# Patient Record
Sex: Female | Born: 1970 | Race: Black or African American | Hispanic: No | Marital: Single | Smoking: Never smoker
Health system: Southern US, Community
[De-identification: ages and names within clinical notes are randomized; demographics above are authoritative.]

## PROBLEM LIST (undated history)

## (undated) DIAGNOSIS — R42 Dizziness and giddiness: Secondary | ICD-10-CM

## (undated) DIAGNOSIS — E079 Disorder of thyroid, unspecified: Secondary | ICD-10-CM

## (undated) HISTORY — PX: LEEP: SHX91

## (undated) HISTORY — DX: Disorder of thyroid, unspecified: E07.9

## (undated) HISTORY — DX: Dizziness and giddiness: R42

## (undated) HISTORY — PX: OTHER SURGICAL HISTORY: SHX169

## (undated) HISTORY — PX: DENTAL SURGERY: SHX609

---

## 2003-06-20 ENCOUNTER — Emergency Department (HOSPITAL_COMMUNITY): Admission: EM | Admit: 2003-06-20 | Discharge: 2003-06-21 | Payer: Self-pay | Admitting: *Deleted

## 2003-10-22 ENCOUNTER — Emergency Department (HOSPITAL_COMMUNITY): Admission: EM | Admit: 2003-10-22 | Discharge: 2003-10-22 | Payer: Self-pay | Admitting: Emergency Medicine

## 2005-01-03 ENCOUNTER — Other Ambulatory Visit: Admission: RE | Admit: 2005-01-03 | Discharge: 2005-01-03 | Payer: Self-pay | Admitting: *Deleted

## 2008-08-11 ENCOUNTER — Encounter: Payer: Self-pay | Admitting: Orthopedic Surgery

## 2008-08-15 ENCOUNTER — Ambulatory Visit: Payer: Self-pay | Admitting: Orthopedic Surgery

## 2008-08-15 DIAGNOSIS — M24876 Other specific joint derangements of unspecified foot, not elsewhere classified: Secondary | ICD-10-CM

## 2008-08-15 DIAGNOSIS — M24873 Other specific joint derangements of unspecified ankle, not elsewhere classified: Secondary | ICD-10-CM

## 2009-09-04 ENCOUNTER — Ambulatory Visit: Payer: Self-pay | Admitting: Orthopedic Surgery

## 2009-09-04 DIAGNOSIS — M542 Cervicalgia: Secondary | ICD-10-CM | POA: Insufficient documentation

## 2009-09-04 DIAGNOSIS — M25519 Pain in unspecified shoulder: Secondary | ICD-10-CM

## 2009-09-04 DIAGNOSIS — M7580 Other shoulder lesions, unspecified shoulder: Secondary | ICD-10-CM

## 2009-10-11 ENCOUNTER — Encounter: Payer: Self-pay | Admitting: Orthopedic Surgery

## 2010-12-03 ENCOUNTER — Other Ambulatory Visit: Payer: Self-pay | Admitting: Obstetrics and Gynecology

## 2010-12-03 DIAGNOSIS — Z1231 Encounter for screening mammogram for malignant neoplasm of breast: Secondary | ICD-10-CM

## 2010-12-10 ENCOUNTER — Ambulatory Visit
Admission: RE | Admit: 2010-12-10 | Discharge: 2010-12-10 | Disposition: A | Discharge status: Home / Self Care | Payer: Managed Care, Other (non HMO) | Source: Ambulatory Visit | Attending: Obstetrics and Gynecology | Admitting: Obstetrics and Gynecology

## 2010-12-10 DIAGNOSIS — Z1231 Encounter for screening mammogram for malignant neoplasm of breast: Secondary | ICD-10-CM

## 2011-11-11 ENCOUNTER — Other Ambulatory Visit: Payer: Self-pay | Admitting: Obstetrics and Gynecology

## 2011-11-11 DIAGNOSIS — Z1231 Encounter for screening mammogram for malignant neoplasm of breast: Secondary | ICD-10-CM

## 2011-12-18 ENCOUNTER — Ambulatory Visit
Admission: RE | Admit: 2011-12-18 | Discharge: 2011-12-18 | Disposition: A | Discharge status: Home / Self Care | Payer: Self-pay | Source: Ambulatory Visit | Attending: Obstetrics and Gynecology | Admitting: Obstetrics and Gynecology

## 2011-12-18 DIAGNOSIS — Z1231 Encounter for screening mammogram for malignant neoplasm of breast: Secondary | ICD-10-CM

## 2013-01-22 ENCOUNTER — Encounter: Payer: Self-pay | Admitting: General Practice

## 2013-01-22 ENCOUNTER — Ambulatory Visit (INDEPENDENT_AMBULATORY_CARE_PROVIDER_SITE_OTHER): Payer: Managed Care, Other (non HMO) | Admitting: General Practice

## 2013-01-22 VITALS — BP 123/87 | HR 70 | Temp 97.0°F | Ht 65.0 in | Wt 228.0 lb

## 2013-01-22 DIAGNOSIS — E039 Hypothyroidism, unspecified: Secondary | ICD-10-CM | POA: Insufficient documentation

## 2013-01-22 DIAGNOSIS — E559 Vitamin D deficiency, unspecified: Secondary | ICD-10-CM

## 2013-01-22 MED ORDER — LEVOTHYROXINE SODIUM 100 MCG PO TABS: 100.0000 ug | ORAL_TABLET | Freq: Every day | ORAL | Status: DC

## 2013-01-22 NOTE — Progress Notes (Signed)
  Subjective:    Patient ID: Linda Rollins, female    DOB: 11/15/1970, 42 y.o.   MRN: [id number]  HPI    SUBJECTIVE: Linda Rollins is a 42 y.o. female here for follow up of hypothyroidism. Has not taken Synthroid in 2 months. Has not been exercising regularly. Started incorporating bread back into diet. Does not eat red meat or drink soda Scheduled Meds: Synthroid 100 mg QD Continuous Infusions: PRN Meds:    No results found for this basename: TSH  Review of systems:  Thyroid ROS: denies fatigue, weight changes, heat/cold intolerance, bowel/skin changes or CVS symptoms, weight gain, feeling cold and cold intolerance and constipation.         Review of Systems     Objective:   Physical Exam  Constitutional: She is oriented to person, place, and time. She appears well-developed and well-nourished.  HENT:  Head: Normocephalic.  Right Ear: External ear normal.  Left Ear: External ear normal.  Eyes: Conjunctivae and EOM are normal. Pupils are equal, round, and reactive to light.  Neck: Normal range of motion. Neck supple.  Cardiovascular: Regular rhythm and normal heart sounds.   Pulmonary/Chest: Effort normal and breath sounds normal.  Abdominal: Soft. Bowel sounds are normal.  Neurological: She is alert and oriented to person, place, and time.  Skin: Skin is warm and dry.  Psychiatric: She has a normal mood and affect. Her behavior is normal. Judgment and thought content normal.   OBJECTIVE: Exam: thyroid is normal in size without nodules or tenderness, no tremor noted.        Assessment & Plan:  Hypothyroidism  Synthyroid 100 mg QD Exercise/Diet F/U in 6 weeks, will check labs then Patient verbalized understanding and denies any questions  Raymon Mutton, FNP-C

## 2013-01-22 NOTE — Patient Instructions (Signed)

## 2013-04-09 ENCOUNTER — Telehealth: Payer: Self-pay | Admitting: Nurse Practitioner

## 2013-04-09 ENCOUNTER — Other Ambulatory Visit: Payer: Self-pay

## 2013-04-09 DIAGNOSIS — Z1231 Encounter for screening mammogram for malignant neoplasm of breast: Secondary | ICD-10-CM

## 2013-04-29 ENCOUNTER — Ambulatory Visit: Payer: Managed Care, Other (non HMO)

## 2013-05-21 ENCOUNTER — Ambulatory Visit
Admission: RE | Admit: 2013-05-21 | Discharge: 2013-05-21 | Disposition: A | Discharge status: Home / Self Care | Payer: Managed Care, Other (non HMO) | Source: Ambulatory Visit

## 2013-05-21 DIAGNOSIS — Z1231 Encounter for screening mammogram for malignant neoplasm of breast: Secondary | ICD-10-CM

## 2013-05-24 ENCOUNTER — Ambulatory Visit (INDEPENDENT_AMBULATORY_CARE_PROVIDER_SITE_OTHER): Payer: Managed Care, Other (non HMO) | Admitting: Nurse Practitioner

## 2013-05-24 ENCOUNTER — Encounter: Payer: Self-pay | Admitting: Nurse Practitioner

## 2013-05-24 ENCOUNTER — Other Ambulatory Visit: Payer: Self-pay

## 2013-05-24 VITALS — BP 129/95 | HR 80 | Temp 97.2°F | Ht 64.0 in | Wt 237.0 lb

## 2013-05-24 DIAGNOSIS — E039 Hypothyroidism, unspecified: Secondary | ICD-10-CM

## 2013-05-24 DIAGNOSIS — R635 Abnormal weight gain: Secondary | ICD-10-CM

## 2013-05-24 MED ORDER — PHENTERMINE HCL 37.5 MG PO TABS: 37.5000 mg | ORAL_TABLET | Freq: Every day | ORAL | Status: DC

## 2013-05-24 MED ORDER — LEVOTHYROXINE SODIUM 100 MCG PO TABS: 100.0000 ug | ORAL_TABLET | Freq: Every day | ORAL | Status: DC

## 2013-05-24 NOTE — Progress Notes (Signed)
  Subjective:    Patient ID: Linda Rollins, female    DOB: [birth year], 42 y.o.   MRN: [id number]  Thyroid Problem Presents for follow-up (hypothyroidism) visit. Patient reports no anxiety, cold intolerance, depressed mood, diaphoresis, diarrhea, dry skin, fatigue, hair loss, hoarse voice, leg swelling, menstrual problem, nail problem, palpitations, visual change, weight gain or weight loss. The symptoms have been stable.  Obesity Patient would like to try a diet pill- has tried dieting alone without success.   Review of Systems  Constitutional: Negative for weight loss, weight gain, diaphoresis and fatigue.  HENT: Negative for hoarse voice.   Cardiovascular: Negative for palpitations.  Gastrointestinal: Negative for diarrhea.  Endocrine: Negative for cold intolerance.  Genitourinary: Negative for menstrual problem.  All other systems reviewed and are negative.       Objective:   Physical Exam  Constitutional: She is oriented to person, place, and time. She appears well-developed and well-nourished.  HENT:  Nose: Nose normal.  Mouth/Throat: Oropharynx is clear and moist.  Eyes: EOM are normal.  Neck: Trachea normal, normal range of motion and full passive range of motion without pain. Neck supple. No JVD present. Carotid bruit is not present. No thyromegaly present.  Cardiovascular: Normal rate, regular rhythm, normal heart sounds and intact distal pulses.  Exam reveals no gallop and no friction rub.   No murmur heard. Pulmonary/Chest: Effort normal and breath sounds normal.  Abdominal: Soft. Bowel sounds are normal. She exhibits no distension and no mass. There is no tenderness.  Musculoskeletal: Normal range of motion.  Lymphadenopathy:    She has no cervical adenopathy.  Neurological: She is alert and oriented to person, place, and time. She has normal reflexes.  Skin: Skin is warm and dry.  Psychiatric: She has a normal mood and affect. Her behavior is normal. Judgment and  thought content normal.    BP 129/95  Pulse 80  Temp(Src) 97.2 F (36.2 C) (Oral)  Ht 5\' 4"  (1.626 m)  Wt 237 lb (107.502 kg)  BMI 40.66 kg/m2       Assessment & Plan:  1. Hypothyroidism - levothyroxine (SYNTHROID, LEVOTHROID) 100 MCG tablet; Take 1 tablet (100 mcg total) by mouth daily.  Dispense: 30 tablet; Refill: 7 - CMP14+EGFR - Thyroid Panel With TSH  2. Morbid obesity Low fat diet and exercise Discussed benefits and risk of adipex- patient showed understanding Recheck in 2 months to see if weight is coming down - phentermine (ADIPEX-P) 37.5 MG tablet; Take 1 tablet (37.5 mg total) by mouth daily before breakfast.  Dispense: 30 tablet; Refill: 2  Mary-Margaret Daphine Deutscher, FNP

## 2013-05-24 NOTE — Patient Instructions (Signed)
1200 Calorie Diabetic Diet The 1200 calorie diabetic diet limits calories to 1200 each day. Following this diet and making healthy meal choices can help improve overall health. It controls blood glucose (sugar) levels and can also help lower blood pressure and cholesterol.  SERVING SIZES Measuring foods and serving sizes helps to make sure you are getting the right amount of food. The list below tells how big or small some common serving sizes are.   1 oz.........4 stacked dice.  3 oz.........Deck of cards.  1 tsp........Tip of little finger.  1 tbs........Thumb.  2 tbs........Golf ball.   cup.......Half of a fist.  1 cup........A fist. GUIDELINES FOR CHOOSING FOODS The goal of this diet is to eat a variety of foods and limit calories to 1200 each day. This can be done by choosing foods that are low in calories and fat. The diet also suggests eating small amounts of food frequently. Doing this helps control your blood glucose levels, so they do not get too high or too low. Each meal or snack may include a protein food source to help you feel more satisfied. Try to eat about the same amount of food around the same time each day. This includes weekend days, travel days, and days off work. Space your meals about 4 to 5 hours apart, and add a snack between them, if you wish.  For example, a daily food plan could include breakfast, a morning snack, lunch, dinner, and an evening snack. Healthy meals and snacks have different types of foods, including whole grains, vegetables, fruits, lean meats, poultry, fish, and dairy products. As you plan your meals, select a variety of foods. Choose from the bread and starch, vegetable, fruit, dairy, and meat/protein groups. Examples of foods from each group are listed below, with their suggested serving sizes. Use measuring cups and spoons to become familiar with what a healthy portion looks like. Bread and Starch Each serving equals 15 grams of  carbohydrate.  1 slice bread.   bagel.   cup cold cereal (unsweetened).   cup hot cereal or mashed potatoes.  1 small potato (size of a computer mouse).   cup cooked pasta or rice.   English muffin.  1 cup broth-based soup.  3 cups of popcorn.  4 to 6 whole-wheat crackers.   cup cooked beans, peas, or corn. Vegetables Each serving equals 5 grams of carbohydrate.   cup cooked vegetables.  1 cup raw vegetables.   cup tomato or vegetable juice. Fruit Each serving equals 15 grams of carbohydrate.  1 small apple or orange.  1  cup watermelon or strawberries.   cup applesauce (no sugar added).  2 tbs raisins.   banana.   cup canned fruit, packed in water or in its own juice.   cup unsweetened fruit juice. Dairy Each serving equals 12 to 15 grams of carbohydrate.  1 cup fat-free milk.  6 oz artificially sweetened yogurt or plain yogurt.  1 cup low-fat buttermilk.  1 cup soy milk.  1 cup almond milk. Meat/Protein  1 large egg.  2 to 3 oz meat, poultry, or fish.   cup low-fat cottage cheese.  1 tbs peanut butter.  1 oz low-fat cheese.   cup tuna, packed in water.   cup tofu. Fat  1 tsp oil.  1 tsp trans-fat-free margarine.  1 tsp butter.  1 tsp mayonnaise.  2 tbs avocado.  1 tbs salad dressing.  1 tbs cream cheese.  2 tbs sour cream. SAMPLE 1200 CALORIE   DIET PLAN Breakfast  1 cup fat-free milk (1 carb serving).  1 small orange (1 carb serving).  1 scrambled egg.  1 slice whole-wheat toast (1 carb serving). Lunch  Sandwich  2 slices whole-wheat bread (2 carb servings).  2 oz lean meat.  2 tsp reduced fat mayonnaise.  1 lettuce leaf.  2 slices tomato.  1 cup carrot sticks. Afternoon Snack  1 small apple (1 carb serving).  1 string cheese. Dinner  2 oz meat.  1 small baked potato (1 carb serving).  1 tsp trans-fat-free margarine.  1 cup steamed broccoli.  1 cup fat-free milk (1 carb  serving). Evening Snack   small banana (1 carb serving).  6 vanilla wafers (1 carb serving). MEAL PLAN You can use this worksheet to help you make a daily meal plan based on the 1200 calorie diabetic diet suggestions. If you are using this plan to help you control your blood glucose, you may interchange carbohydrate containing foods (dairy, starches, and fruits). Select a variety of fresh foods of varying colors and flavors. The total amount of carbohydrate in your meals or snacks is more important than making sure you include all of the food groups every time you eat. You can choose from approximately this many of the following foods to build your day's meals:  6 Starches.  3 Vegetables.  2 Fruits.  2 Dairy.  4 to 6 oz Meat/Protein.  Up to 3 Fats. Your dietician can use this worksheet to help you decide how many servings and which types of foods are right for you. BREAKFAST Food Group and Servings / Food Choice Starch _________________________________________________________ Dairy __________________________________________________________ Fruit ___________________________________________________________ Meat/Protein____________________________________________________ Fat ____________________________________________________________ LUNCH Food Group and Servings / Food Choice  Starch _________________________________________________________ Meat/Protein ___________________________________________________ Vegetables _____________________________________________________ Fruit __________________________________________________________ Dairy __________________________________________________________ Fat ____________________________________________________________ AFTERNOON SNACK Food Group and Servings / Food Choice Dairy __________________________________________________________ Fruit ___________________________________________________________ Starch  __________________________________________________________ Meat/Protein____________________________________________________ DINNER Food Group and Servings / Food Choice Starch _________________________________________________________ Meat/Protein ___________________________________________________ Dairy __________________________________________________________ Vegetables _____________________________________________________ Fruit __________________________________________________________ Fat ____________________________________________________________ EVENING SNACK Food Group and Servings / Food Choice Fruit ___________________________________________________________ Meat/Protein ____________________________________________________ Dairy __________________________________________________________ Starch __________________________________________________________ DAILY TOTALS Starches _________________________ Vegetables _______________________ Fruits ____________________________ Dairy ____________________________ Meat/Protein______________________ Fats _____________________________ Document Released: 05/06/2005 Document Revised: 01/06/2012 Document Reviewed: 08/31/2009 [employer] Patient Information 2014 [employer].  

## 2013-05-25 ENCOUNTER — Other Ambulatory Visit: Payer: Self-pay | Admitting: Nurse Practitioner

## 2013-05-25 LAB — CMP14+EGFR
AST: 21 IU/L (ref 0–40)
Albumin/Globulin Ratio: 1.5 (ref 1.1–2.5)
Alkaline Phosphatase: 64 IU/L (ref 39–117)
BUN/Creatinine Ratio: 17 (ref 9–23)
CO2: 21 mmol/L (ref 18–29)
Creatinine, Ser: 0.78 mg/dL (ref 0.57–1.00)
Globulin, Total: 2.8 g/dL (ref 1.5–4.5)
Sodium: 142 mmol/L (ref 134–144)

## 2013-05-25 LAB — THYROID PANEL WITH TSH: T4, Total: 7.5 ug/dL (ref 4.5–12.0)

## 2013-05-25 MED ORDER — LEVOTHYROXINE SODIUM 125 MCG PO TABS: 125.0000 ug | ORAL_TABLET | Freq: Every day | ORAL | Status: DC

## 2013-05-27 ENCOUNTER — Telehealth: Payer: Self-pay | Admitting: Nurse Practitioner

## 2013-05-28 ENCOUNTER — Telehealth: Payer: Self-pay | Admitting: Nurse Practitioner

## 2013-05-28 NOTE — Telephone Encounter (Signed)
Patient notified of lab results

## 2013-12-01 ENCOUNTER — Other Ambulatory Visit: Payer: Self-pay | Admitting: Nurse Practitioner

## 2013-12-10 ENCOUNTER — Other Ambulatory Visit: Payer: Self-pay | Admitting: Nurse Practitioner

## 2013-12-17 ENCOUNTER — Emergency Department (HOSPITAL_COMMUNITY): Payer: Managed Care, Other (non HMO)

## 2013-12-17 ENCOUNTER — Encounter (HOSPITAL_COMMUNITY): Payer: Self-pay | Admitting: Emergency Medicine

## 2013-12-17 ENCOUNTER — Emergency Department (HOSPITAL_COMMUNITY)
Admission: EM | Admit: 2013-12-17 | Discharge: 2013-12-17 | Disposition: A | Discharge status: Home / Self Care | Payer: Managed Care, Other (non HMO) | Attending: Emergency Medicine | Admitting: Emergency Medicine

## 2013-12-17 DIAGNOSIS — R079 Chest pain, unspecified: Secondary | ICD-10-CM

## 2013-12-17 DIAGNOSIS — R0789 Other chest pain: Secondary | ICD-10-CM | POA: Insufficient documentation

## 2013-12-17 DIAGNOSIS — R51 Headache: Secondary | ICD-10-CM | POA: Insufficient documentation

## 2013-12-17 DIAGNOSIS — Z79899 Other long term (current) drug therapy: Secondary | ICD-10-CM | POA: Insufficient documentation

## 2013-12-17 DIAGNOSIS — R519 Headache, unspecified: Secondary | ICD-10-CM

## 2013-12-17 DIAGNOSIS — E079 Disorder of thyroid, unspecified: Secondary | ICD-10-CM | POA: Insufficient documentation

## 2013-12-17 LAB — BASIC METABOLIC PANEL
BUN: 8 mg/dL (ref 6–23)
CALCIUM: 9.2 mg/dL (ref 8.4–10.5)
CO2: 28 mEq/L (ref 19–32)
CREATININE: 0.87 mg/dL (ref 0.50–1.10)
Chloride: 104 mEq/L (ref 96–112)
GFR calc Af Amer: >90 mL/min (ref >90)
GFR calc non Af Amer: 80 mL/min — ABNORMAL LOW (ref >90)
Glucose, Bld: 80 mg/dL (ref 70–99)
Potassium: 3.6 mEq/L — ABNORMAL LOW (ref 3.7–5.3)
Sodium: 142 mEq/L (ref 137–147)

## 2013-12-17 LAB — CBC WITH DIFFERENTIAL/PLATELET
Basophils Absolute: 0 10*3/uL (ref 0.0–0.1)
Basophils Relative: 0 % (ref 0–1)
EOS ABS: 0.1 10*3/uL (ref 0.0–0.7)
EOS PCT: 1 % (ref 0–5)
HCT: 37.3 % (ref 36.0–46.0)
HEMOGLOBIN: 12.7 g/dL (ref 12.0–15.0)
Lymphocytes Relative: 24 % (ref 12–46)
Lymphs Abs: 1.8 10*3/uL (ref 0.7–4.0)
MCH: 30.3 pg (ref 26.0–34.0)
MCHC: 34 g/dL (ref 30.0–36.0)
MCV: 89 fL (ref 78.0–100.0)
MONOS PCT: 5 % (ref 3–12)
Monocytes Absolute: 0.4 10*3/uL (ref 0.1–1.0)
Neutro Abs: 5.4 10*3/uL (ref 1.7–7.7)
Neutrophils Relative %: 70 % (ref 43–77)
Platelets: 306 10*3/uL (ref 150–400)
RBC: 4.19 MIL/uL (ref 3.87–5.11)
RDW: 12 % (ref 11.5–15.5)
WBC: 7.7 10*3/uL (ref 4.0–10.5)

## 2013-12-17 LAB — TROPONIN I
Troponin I: <0.3 ng/mL (ref <0.30)
Troponin I: <0.3 ng/mL (ref <0.30)

## 2013-12-17 MED ORDER — SODIUM CHLORIDE 0.9 % IV BOLUS (SEPSIS)
1000.0000 mL | Freq: Once | INTRAVENOUS | Status: AC
  Administered 2013-12-17: 1000 mL via INTRAVENOUS

## 2013-12-17 MED ORDER — KETOROLAC TROMETHAMINE 30 MG/ML IJ SOLN
30.0000 mg | Freq: Once | INTRAMUSCULAR | Status: AC
  Administered 2013-12-17: 30 mg via INTRAVENOUS
  Filled 2013-12-17: qty 1

## 2013-12-17 NOTE — ED Notes (Signed)
PT STATES SHE HAS BAD HEADACHE. STARTED THIS MORNING. PAIN ALSO RADIATES TO LEFT ARM. STATES SHE HAS BEEN UNDER A LOT OF STRESS LATELY. SHE SAYS SHE HAS HAD MIGRAINES IN PAST BUT THIS IS MUCH WORSE. POINTS TO PAIN LOCATION AS ACROSS FOREHEAD AND IN TEMPORAL AREAS.

## 2013-12-17 NOTE — ED Provider Notes (Signed)
CSN: [id number]     Arrival date & time 12/17/13  1205 History   First MD Initiated Contact with Patient 12/17/13 1233     Chief Complaint  Patient presents with  . Headache     (Consider location/radiation/quality/duration/timing/severity/associated sxs/prior Treatment) HPI  This is a 27 are old female with history of thyroid disease who presents with headache and chest pain.  Patient reports one week of headache. She states it's gradually gotten worse and worse and this morning. Rates pain a 10/10. Has not taken anything at home for it. She denies any fevers or neck stiffness. She has a remote history of migraines but feels this is worse. She denies any vision changes, weakness, numbness, tingling. Patient also reports intermittent chest pain and left arm pressure over the last several days. It is not worse with exertion.  She denies any shortness of breath diaphoresis. She denies a history of hypertension, hyperlipidemia, or early family history of heart disease. She's not a current smoker. She reports increased stress at home.  Past Medical History  Diagnosis Date  . Thyroid disease    History reviewed. No pertinent past surgical history. History reviewed. No pertinent family history. History  Substance Use Topics  . Smoking status: Never Smoker   . Smokeless tobacco: Not on file  . Alcohol Use: No   OB History   Grav Para Term Preterm Abortions TAB SAB Ect Mult Living   2 1             Review of Systems  Constitutional: Negative for fever.  Respiratory: Positive for chest tightness. Negative for cough and shortness of breath.   Cardiovascular: Positive for chest pain. Negative for leg swelling.  Gastrointestinal: Negative for nausea, vomiting and abdominal pain.  Genitourinary: Negative for dysuria.  Musculoskeletal: Negative for back pain and neck pain.  Skin: Negative for wound.  Neurological: Positive for headaches. Negative for dizziness, speech difficulty, weakness  and numbness.  Psychiatric/Behavioral: Negative for confusion.  All other systems reviewed and are negative.      Allergies  Review of patient's allergies indicates no known allergies.  Home Medications   Current Outpatient Rx  Name  Route  Sig  Dispense  Refill  . acetaminophen (TYLENOL) 500 MG tablet   Oral   Take 1,000 mg by mouth daily as needed for headache.         . levothyroxine (SYNTHROID, LEVOTHROID) 125 MCG tablet   Oral   Take 1 tablet (125 mcg total) by mouth daily.   90 tablet   1    BP 122/48  Pulse 65  Temp(Src) 98 F (36.7 C) (Oral)  Resp 18  SpO2 98%  LMP 12/17/2013 Physical Exam  Nursing note and vitals reviewed. Constitutional: She is oriented to person, place, and time. She appears well-developed and well-nourished. No distress.  HENT:  Head: Normocephalic and atraumatic.  Eyes: Pupils are equal, round, and reactive to light.  Neck: Neck supple.  Cardiovascular: Normal rate, regular rhythm and normal heart sounds.   No murmur heard. Pulmonary/Chest: Effort normal and breath sounds normal. No respiratory distress. She has no wheezes.  Abdominal: Soft. Bowel sounds are normal. There is no tenderness. There is no rebound.  Musculoskeletal: She exhibits no edema.  Neurological: She is alert and oriented to person, place, and time.  Cranial nerves II through XII intact. No dysmetria to finger-nose-finger. 5/5 strength in extremities  Skin: Skin is warm and dry.  Psychiatric: She has a normal mood and affect.  ED Course  Procedures (including critical care time) Labs Review Labs Reviewed  BASIC METABOLIC PANEL - Abnormal; Notable for the following:    Potassium 3.6 (*)    GFR calc non Af Amer 80 (*)    All other components within normal limits  CBC WITH DIFFERENTIAL  TROPONIN I  TROPONIN I   Imaging Review Dg Chest 2 View  12/17/2013   CLINICAL DATA:  Chest tightness  EXAM: CHEST  2 VIEW  COMPARISON:  None.  FINDINGS: The lungs are  clear. Heart size and pulmonary vascularity are normal. No adenopathy. No pneumothorax. There is slight upper thoracic dextroscoliosis.  IMPRESSION: No edema or consolidation.   Electronically Signed   By: Bretta BangWilliam  Woodruff M.D.   On: 12/17/2013 13:12    EKG Interpretation    Date/Time:  Friday December 17 2013 12:55:14 EST Ventricular Rate:  67 PR Interval:  178 QRS Duration: 98 QT Interval:  396 QTC Calculation: 418 R Axis:   34 Text Interpretation:  Normal sinus rhythm Normal ECG No previous ECGs available Confirmed by HORTON  MD, COURTNEY ([id number]) on 12/17/2013 2:29:45 PM            MDM   Final diagnoses:  Headache  Chest pain   Patient presents with headache and intermittent chest pain. She is low-risk for ACS and has no risk factors. Initial EKG normal and troponin negative. Chest x-ray negative. Regarding the patient's headache, she is nonfocal exam. She's not taken anything for her headache. She was given fluids and Toradol with marked improvement of her pain. She is afebrile and has no meningismus on exam. Low suspicion for meningitis or intracranial abnormality. Delta troponin is negative. Patient is low risk. She will followup with her primary care physician for referral for stress testing.  After history, exam, and medical workup I feel the patient has been appropriately medically screened and is safe for discharge home. Pertinent diagnoses were discussed with the patient. Patient was given return precautions.     Shon Batonourtney F Horton, MD 12/18/13 [id number]

## 2013-12-17 NOTE — ED Notes (Addendum)
Pt c/o headache x 2-3 days. C/o left arm numbness since yesterday. Chest tightness since last night intermittent. Pt states she is under a lot of stress. Nad. Nondiaphoretic. Denies cp at this time.

## 2013-12-17 NOTE — Discharge Instructions (Signed)
Chest Pain (Nonspecific) °It is often hard to give a specific diagnosis for the cause of chest pain. There is always a chance that your pain could be related to something serious, such as a heart attack or a blood clot in the lungs. You need to follow up with your caregiver for further evaluation. °CAUSES  °· Heartburn. °· Pneumonia or bronchitis. °· Anxiety or stress. °· Inflammation around your heart (pericarditis) or lung (pleuritis or pleurisy). °· A blood clot in the lung. °· A collapsed lung (pneumothorax). It can develop suddenly on its own (spontaneous pneumothorax) or from injury (trauma) to the chest. °· Shingles infection (herpes zoster virus). °The chest wall is composed of bones, muscles, and cartilage. Any of these can be the source of the pain. °· The bones can be bruised by injury. °· The muscles or cartilage can be strained by coughing or overwork. °· The cartilage can be affected by inflammation and become sore (costochondritis). °DIAGNOSIS  °Lab tests or other studies, such as X-rays, electrocardiography, stress testing, or cardiac imaging, may be needed to find the cause of your pain.  °TREATMENT  °· Treatment depends on what may be causing your chest pain. Treatment may include: °· Acid blockers for heartburn. °· Anti-inflammatory medicine. °· Pain medicine for inflammatory conditions. °· Antibiotics if an infection is present. °· You may be advised to change lifestyle habits. This includes stopping smoking and avoiding alcohol, caffeine, and chocolate. °· You may be advised to keep your head raised (elevated) when sleeping. This reduces the chance of acid going backward from your stomach into your esophagus. °· Most of the time, nonspecific chest pain will improve within 2 to 3 days with rest and mild pain medicine. °HOME CARE INSTRUCTIONS  °· If antibiotics were prescribed, take your antibiotics as directed. Finish them even if you start to feel better. °· For the next few days, avoid physical  activities that bring on chest pain. Continue physical activities as directed. °· Do not smoke. °· Avoid drinking alcohol. °· Only take over-the-counter or prescription medicine for pain, discomfort, or fever as directed by your caregiver. °· Follow your caregiver's suggestions for further testing if your chest pain does not go away. °· Keep any follow-up appointments you made. If you do not go to an appointment, you could develop lasting (chronic) problems with pain. If there is any problem keeping an appointment, you must call to reschedule. °SEEK MEDICAL CARE IF:  °· You think you are having problems from the medicine you are taking. Read your medicine instructions carefully. °· Your chest pain does not go away, even after treatment. °· You develop a rash with blisters on your chest. °SEEK IMMEDIATE MEDICAL CARE IF:  °· You have increased chest pain or pain that spreads to your arm, neck, jaw, back, or abdomen. °· You develop shortness of breath, an increasing cough, or you are coughing up blood. °· You have severe back or abdominal pain, feel nauseous, or vomit. °· You develop severe weakness, fainting, or chills. °· You have a fever. °THIS IS AN EMERGENCY. Do not wait to see if the pain will go away. Get medical help at once. Call your local emergency services (911 in [place] Do not drive yourself to the hospital. °MAKE SURE YOU:  °· Understand these instructions. °· Will watch your condition. °· Will get help right away if you are not doing well or get worse. °Document Released: 07/24/2005 Document Revised: 01/06/2012 Document Reviewed: 05/19/2008 °Exit[employer]® Patient Information ©2014 [employer]. ° °Migraine Headache °A migraine headache is an intense, throbbing pain on one or both sides of your head. A migraine can last for 30 minutes to several hours. °CAUSES  °The exact cause of a migraine headache is not always known. However, a migraine may be caused when nerves in the brain become irritated and release  chemicals that cause inflammation. This causes pain. °Certain things may also trigger migraines, such as: °· Alcohol. °· Smoking. °· Stress. °· Menstruation. °· Aged cheeses. °· Foods or drinks that contain nitrates, glutamate, aspartame, or tyramine. °· Lack of sleep. °· Chocolate. °· Caffeine. °· Hunger. °· Physical exertion. °· Fatigue. °· Medicines used to treat chest pain (nitroglycerine), birth control pills, estrogen, and some blood pressure medicines. °SIGNS AND SYMPTOMS °· Pain on one or both sides of your head. °· Pulsating or throbbing pain. °· Severe pain that prevents daily activities. °· Pain that is aggravated by any physical activity. °· Nausea, vomiting, or both. °· Dizziness. °· Pain with exposure to bright lights, loud noises, or activity. °· General sensitivity to bright lights, loud noises, or smells. °Before you get a migraine, you may get warning signs that a migraine is coming (aura). An aura may include: °· Seeing flashing lights. °· Seeing bright spots, halos, or zig-zag lines. °· Having tunnel vision or blurred vision. °· Having feelings of numbness or tingling. °· Having trouble talking. °· Having muscle weakness. °DIAGNOSIS  °A migraine headache is often diagnosed based on: °· Symptoms. °· Physical exam. °· A CT scan or MRI of your head. These imaging tests cannot diagnose migraines, but they can help rule out other causes of headaches. °TREATMENT °Medicines may be given for pain and nausea. Medicines can also be given to help prevent recurrent migraines.  °HOME CARE INSTRUCTIONS °· Only take over-the-counter or prescription medicines for pain or discomfort as directed by your health care provider. The use of long-term narcotics is not recommended. °· Lie down in a dark, quiet room when you have a migraine. °· Keep a journal to find out what may trigger your migraine headaches. For example, write down: °· What you eat and drink. °· How much sleep you get. °· Any change to your diet or  medicines. °· Limit alcohol consumption. °· Quit smoking if you smoke. °· Get 7 9 hours of sleep, or as recommended by your health care provider. °· Limit stress. °· Keep lights dim if bright lights bother you and make your migraines worse. °SEEK IMMEDIATE MEDICAL CARE IF:  °· Your migraine becomes severe. °· You have a fever. °· You have a stiff neck. °· You have vision loss. °· You have muscular weakness or loss of muscle control. °· You start losing your balance or have trouble walking. °· You feel faint or pass out. °· You have severe symptoms that are different from your first symptoms. °MAKE SURE YOU:  °· Understand these instructions. °· Will watch your condition. °· Will get help right away if you are not doing well or get worse. °Document Released: 10/14/2005 Document Revised: 08/04/2013 Document Reviewed: 06/21/2013 °ExitCare® Patient Information ©2014 [employer]. ° °

## 2014-05-17 ENCOUNTER — Other Ambulatory Visit: Payer: Self-pay

## 2014-05-17 DIAGNOSIS — Z1231 Encounter for screening mammogram for malignant neoplasm of breast: Secondary | ICD-10-CM

## 2014-06-13 ENCOUNTER — Ambulatory Visit
Admission: RE | Admit: 2014-06-13 | Discharge: 2014-06-13 | Disposition: A | Discharge status: Home / Self Care | Payer: Managed Care, Other (non HMO) | Source: Ambulatory Visit

## 2014-06-13 DIAGNOSIS — Z1231 Encounter for screening mammogram for malignant neoplasm of breast: Secondary | ICD-10-CM

## 2014-08-29 ENCOUNTER — Encounter (HOSPITAL_COMMUNITY): Payer: Self-pay | Admitting: Emergency Medicine

## 2015-04-03 ENCOUNTER — Emergency Department (HOSPITAL_COMMUNITY)
Admission: EM | Admit: 2015-04-03 | Discharge: 2015-04-03 | Disposition: A | Discharge status: Home / Self Care | Payer: BLUE CROSS/BLUE SHIELD | Attending: Emergency Medicine | Admitting: Emergency Medicine

## 2015-04-03 ENCOUNTER — Encounter (HOSPITAL_COMMUNITY): Payer: Self-pay | Admitting: Emergency Medicine

## 2015-04-03 DIAGNOSIS — Y9302 Activity, running: Secondary | ICD-10-CM | POA: Insufficient documentation

## 2015-04-03 DIAGNOSIS — Y998 Other external cause status: Secondary | ICD-10-CM | POA: Diagnosis not present

## 2015-04-03 DIAGNOSIS — Z79899 Other long term (current) drug therapy: Secondary | ICD-10-CM | POA: Diagnosis not present

## 2015-04-03 DIAGNOSIS — S3991XA Unspecified injury of abdomen, initial encounter: Secondary | ICD-10-CM | POA: Diagnosis present

## 2015-04-03 DIAGNOSIS — W01198A Fall on same level from slipping, tripping and stumbling with subsequent striking against other object, initial encounter: Secondary | ICD-10-CM | POA: Insufficient documentation

## 2015-04-03 DIAGNOSIS — Y9289 Other specified places as the place of occurrence of the external cause: Secondary | ICD-10-CM | POA: Insufficient documentation

## 2015-04-03 DIAGNOSIS — R109 Unspecified abdominal pain: Secondary | ICD-10-CM

## 2015-04-03 DIAGNOSIS — E079 Disorder of thyroid, unspecified: Secondary | ICD-10-CM | POA: Insufficient documentation

## 2015-04-03 MED ORDER — NAPROXEN 250 MG PO TABS
500.0000 mg | ORAL_TABLET | Freq: Once | ORAL | Status: AC
  Administered 2015-04-03: 500 mg via ORAL
  Filled 2015-04-03: qty 2

## 2015-04-03 MED ORDER — NAPROXEN 500 MG PO TABS: 500.0000 mg | ORAL_TABLET | Freq: Two times a day (BID) | ORAL | Status: DC

## 2015-04-03 MED ORDER — CYCLOBENZAPRINE HCL 10 MG PO TABS
10.0000 mg | ORAL_TABLET | Freq: Once | ORAL | Status: AC
  Administered 2015-04-03: 10 mg via ORAL
  Filled 2015-04-03: qty 1

## 2015-04-03 MED ORDER — CYCLOBENZAPRINE HCL 10 MG PO TABS: 10.0000 mg | ORAL_TABLET | Freq: Two times a day (BID) | ORAL | Status: DC | PRN

## 2015-04-03 NOTE — ED Notes (Signed)
Larey SeatFell trying to get out of rain yesterday at home.  Rates pain 9/10.  Pain is in lower abdomen.  Tool motrin and biofreeze

## 2015-04-03 NOTE — Discharge Instructions (Signed)
Medication for pain and muscle relaxation. Return if worse in anyway.

## 2015-04-03 NOTE — ED Provider Notes (Signed)
CSN: [id number]     Arrival date & time 04/03/15  0719 History  This chart was scribed for Donnetta HutchingBrian Douglas Smolinsky, MD by Tanda RockersMargaux Venter, ED Scribe. This patient was seen in room [facility] and the patient's care was started at 8:16 AM.    Chief Complaint  Patient presents with  . Abdominal Pain   The history is provided by the patient. No language interpreter was used.     HPI Comments: Linda Rollins is a 44 y.o. female who presents to the Emergency Department complaining of gradual onset lower abdominal pain that began this morning. Pt states that she was running out of [employer] last night and slipped on the wet ground, causing her to fall. Pt landed on her left hip but denies any hip pain. She attributes her abdominal pain to the fall. She describes the pain as a tightness. Pt is unable to bend or turn without pain. She also notes minimal blood in urine. She denies any other urinary symptoms or possibility of being pregnant.   Past Medical History  Diagnosis Date  . Thyroid disease    History reviewed. No pertinent past surgical history. History reviewed. No pertinent family history. History  Substance Use Topics  . Smoking status: Never Smoker   . Smokeless tobacco: Not on file  . Alcohol Use: No   OB History    Gravida Para Term Preterm AB TAB SAB Ectopic Multiple Living   2 1             Review of Systems  A complete 10 system review of systems was obtained and all systems are negative except as noted in the HPI and PMH.    Allergies  Review of patient's allergies indicates no known allergies.  Home Medications   Prior to Admission medications   Medication Sig Start Date End Date Taking? Authorizing Provider  ibuprofen (ADVIL,MOTRIN) 200 MG tablet Take 400 mg by mouth every 6 (six) hours as needed for moderate pain.   Yes Historical Provider, MD  levothyroxine (SYNTHROID, LEVOTHROID) 125 MCG tablet Take 1 tablet (125 mcg total) by mouth daily. Patient taking differently: Take  150 mcg by mouth daily.  05/25/13  Yes Mary-Margaret Daphine DeutscherMartin, FNP  cyclobenzaprine (FLEXERIL) 10 MG tablet Take 1 tablet (10 mg total) by mouth 2 (two) times daily as needed for muscle spasms. 04/03/15   Donnetta HutchingBrian Rolondo Pierre, MD  naproxen (NAPROSYN) 500 MG tablet Take 1 tablet (500 mg total) by mouth 2 (two) times daily. 04/03/15   Donnetta HutchingBrian Indea Dearman, MD   Triage Vitals: BP 123/64 mmHg  Pulse 72  Temp(Src) 98 F (36.7 C)  Resp 18  Ht 5\' 4"  (1.626 m)  Wt 230 lb (104.327 kg)  BMI 39.46 kg/m2  SpO2 100%  LMP 03/27/2015   Physical Exam  Constitutional: She is oriented to person, place, and time. She appears well-developed and well-nourished.  HENT:  Head: Normocephalic and atraumatic.  Eyes: Conjunctivae and EOM are normal. Pupils are equal, round, and reactive to light.  Neck: Normal range of motion. Neck supple.  Cardiovascular: Normal rate and regular rhythm.   Pulmonary/Chest: Effort normal and breath sounds normal.  Abdominal: Soft. Bowel sounds are normal. There is tenderness.  Minimal bilateral lower abdominal tenderness.   Musculoskeletal: Normal range of motion.  Neurological: She is alert and oriented to person, place, and time.  Skin: Skin is warm and dry.  Psychiatric: She has a normal mood and affect. Her behavior is normal.  Nursing note and vitals reviewed.  ED Course  Procedures (including critical care time)  DIAGNOSTIC STUDIES: Oxygen Saturation is 100% on RA, normal by my interpretation.    COORDINATION OF CARE: 8:19 AM-Discussed treatment plan which includes Flexeril and Naprosyn with pt at bedside and pt agreed to plan. Suspect musculoskeletal issue.   Labs Review Labs Reviewed - No data to display  Imaging Review No results found.   EKG Interpretation None      MDM   Final diagnoses:  Abdominal pain, unspecified abdominal location   No acute abdomen. History and physical consistent with musculoskeletal strain.  She understands to return for worsening symptoms.  Discharge medications Flexeril 10 mg and Naprosyn 500 mg   I personally performed the services described in this documentation, which was scribed in my presence. The recorded information has been reviewed and is accurate.      Donnetta Hutching, MD 04/03/15 1001

## 2015-06-14 ENCOUNTER — Other Ambulatory Visit: Payer: Self-pay

## 2015-06-14 DIAGNOSIS — Z1231 Encounter for screening mammogram for malignant neoplasm of breast: Secondary | ICD-10-CM

## 2015-07-06 ENCOUNTER — Ambulatory Visit: Payer: Managed Care, Other (non HMO)

## 2015-07-10 ENCOUNTER — Ambulatory Visit
Admission: RE | Admit: 2015-07-10 | Discharge: 2015-07-10 | Disposition: A | Discharge status: Home / Self Care | Payer: BLUE CROSS/BLUE SHIELD | Source: Ambulatory Visit

## 2015-07-10 DIAGNOSIS — Z1231 Encounter for screening mammogram for malignant neoplasm of breast: Secondary | ICD-10-CM

## 2016-08-21 ENCOUNTER — Other Ambulatory Visit: Payer: Self-pay | Admitting: Family Medicine

## 2016-08-21 DIAGNOSIS — Z1231 Encounter for screening mammogram for malignant neoplasm of breast: Secondary | ICD-10-CM

## 2016-09-06 ENCOUNTER — Ambulatory Visit: Payer: BLUE CROSS/BLUE SHIELD

## 2016-12-11 ENCOUNTER — Ambulatory Visit
Admission: RE | Admit: 2016-12-11 | Discharge: 2016-12-11 | Disposition: A | Discharge status: Home / Self Care | Payer: Managed Care, Other (non HMO) | Source: Ambulatory Visit | Attending: Family Medicine | Admitting: Family Medicine

## 2016-12-11 DIAGNOSIS — Z1231 Encounter for screening mammogram for malignant neoplasm of breast: Secondary | ICD-10-CM

## 2017-06-29 ENCOUNTER — Encounter (HOSPITAL_COMMUNITY): Payer: Self-pay | Admitting: Cardiology

## 2017-06-29 ENCOUNTER — Emergency Department (HOSPITAL_COMMUNITY)
Admission: EM | Admit: 2017-06-29 | Discharge: 2017-06-29 | Disposition: A | Discharge status: Home / Self Care | Payer: Managed Care, Other (non HMO) | Attending: Emergency Medicine | Admitting: Emergency Medicine

## 2017-06-29 DIAGNOSIS — Z79899 Other long term (current) drug therapy: Secondary | ICD-10-CM | POA: Diagnosis not present

## 2017-06-29 DIAGNOSIS — R220 Localized swelling, mass and lump, head: Secondary | ICD-10-CM | POA: Diagnosis present

## 2017-06-29 DIAGNOSIS — L739 Follicular disorder, unspecified: Secondary | ICD-10-CM | POA: Diagnosis not present

## 2017-06-29 MED ORDER — CEPHALEXIN 500 MG PO CAPS: 500.0000 mg | ORAL_CAPSULE | Freq: Four times a day (QID) | ORAL | 0 refills | Status: DC

## 2017-06-29 NOTE — ED Provider Notes (Signed)
[facility]EM[facility]Y DEPT Provider Note   CSN: [id number] Arrival date & time: 06/29/17  1458     History   Chief Complaint Chief Complaint  Patient presents with  . Insect Bite    HPI Linda Rollins is a 46 y.o. female presenting with pain and swelling at her right posterior scalp.  She woke with symptoms 2 mornings ago and suspects possible insect bite. She has increased pain today and now has an additional painful knot with radiation of pain into her neck earlier today but now currently resolved.  She has had no treatments prior to arrival.  She denies fevers, chills, sore throat, n/v or abdominal pain.  The history is provided by the patient.    Past Medical History:  Diagnosis Date  . Thyroid disease     Patient Active Problem List   Diagnosis Date Noted  . Unspecified hypothyroidism 01/22/2013  . SHOULDER PAIN 09/04/2009  . CERVICALGIA 09/04/2009  . IMPINGEMENT SYNDROME 09/04/2009  . ANKLE INSTABILITY 08/15/2008    History reviewed. No pertinent surgical history.  OB History    Gravida Para Term Preterm AB Living   2 1           SAB TAB Ectopic Multiple Live Births                   Home Medications    Prior to Admission medications   Medication Sig Start Date End Date Taking? Authorizing Provider  cephALEXin (KEFLEX) 500 MG capsule Take 1 capsule (500 mg total) by mouth 4 (four) times daily. 06/29/17   Burgess AmorIdol, Jodey Burbano, PA-C  cyclobenzaprine (FLEXERIL) 10 MG tablet Take 1 tablet (10 mg total) by mouth 2 (two) times daily as needed for muscle spasms. 04/03/15   Donnetta Hutchingook, Brian, MD  ibuprofen (ADVIL,MOTRIN) 200 MG tablet Take 400 mg by mouth every 6 (six) hours as needed for moderate pain.    [provider]  levothyroxine (SYNTHROID, LEVOTHROID) 125 MCG tablet Take 1 tablet (125 mcg total) by mouth daily. Patient taking differently: Take 150 mcg by mouth daily.  05/25/13   Daphine DeutscherMartin, Mary-Margaret, FNP  naproxen (NAPROSYN) 500 MG tablet Take 1 tablet (500 mg total) by  mouth 2 (two) times daily. 04/03/15   Donnetta Hutchingook, Brian, MD    Family History History reviewed. No pertinent family history.  Social History Social History  Substance Use Topics  . Smoking status: Never Smoker  . Smokeless tobacco: Not on file  . Alcohol use No     Allergies   Patient has no known allergies.   Review of Systems Review of Systems  Constitutional: Negative for chills and fever.  Respiratory: Negative for shortness of breath and wheezing.   Skin: Negative for rash.       Negative except as mentioned in HPI.   Neurological: Negative for numbness.     Physical Exam Updated Vital Signs BP 127/76   Pulse 87   Temp 98.4 F (36.9 C) (Oral)   Resp 18   Ht 5\' 4"  (1.626 m)   Wt 111.6 kg (246 lb)   LMP 06/29/2017   SpO2 98%   BMI 42.23 kg/m   Physical Exam  Constitutional: She appears well-developed and well-nourished. No distress.  HENT:  Head: Normocephalic.  Eyes: Conjunctivae are normal.  Neck: Normal range of motion. Neck supple. No neck rigidity. No erythema and normal range of motion present.  Cardiovascular: Normal rate.   Pulmonary/Chest: Effort normal. She has no wheezes.  Musculoskeletal: Normal range of motion.  She exhibits no edema.  Skin: There is erythema.  2 cm area of erythema with central small infected hair follicle right posterior scalp.  Area of tenderness inferior to the initial lesion but with normal scalp appearance.     ED Treatments / Results  Labs (all labs ordered are listed, but only abnormal results are displayed) Labs Reviewed - No data to display  EKG  EKG Interpretation None       Radiology No results found.  Procedures Procedures (including critical care time)  Medications Ordered in ED Medications - No data to display   Initial Impression / Assessment and Plan / ED Course  I have reviewed the triage vital signs and the nursing notes.  Pertinent labs & imaging results that were available during my care of  the patient were reviewed by me and considered in my medical decision making (see chart for details).     Scalp folliculitis.  Warm compresses, keflex. F/u with pcp for persistent or worsened sx.   Final Clinical Impressions(s) / ED Diagnoses   Final diagnoses:  Folliculitis    New Prescriptions Discharge Medication List as of 06/29/2017  4:01 PM    START taking these medications   Details  cephALEXin (KEFLEX) 500 MG capsule Take 1 capsule (500 mg total) by mouth 4 (four) times daily., Starting Sun 06/29/2017, Print         Burgess Amor, PA-C 06/29/17 1656    Linwood Dibbles, MD 07/02/17 [phone]

## 2017-06-29 NOTE — Discharge Instructions (Signed)
As discussed, you appear to have an infected hair follicle in your scalp. Apply warm compresses 15 minutes several times daily in addition to completing the antibiotic course.

## 2017-06-29 NOTE — ED Triage Notes (Signed)
2 insect bites to back of head yesterday.  C/o neck stiffness.

## 2018-01-22 ENCOUNTER — Other Ambulatory Visit: Payer: Self-pay | Admitting: Family Medicine

## 2018-01-22 DIAGNOSIS — Z1231 Encounter for screening mammogram for malignant neoplasm of breast: Secondary | ICD-10-CM

## 2018-02-13 ENCOUNTER — Ambulatory Visit: Payer: Managed Care, Other (non HMO)

## 2018-03-06 ENCOUNTER — Encounter: Payer: Self-pay | Admitting: Radiology

## 2018-03-06 ENCOUNTER — Ambulatory Visit
Admission: RE | Admit: 2018-03-06 | Discharge: 2018-03-06 | Disposition: A | Discharge status: Home / Self Care | Payer: Managed Care, Other (non HMO) | Source: Ambulatory Visit | Attending: Family Medicine | Admitting: Family Medicine

## 2018-03-06 DIAGNOSIS — Z1231 Encounter for screening mammogram for malignant neoplasm of breast: Secondary | ICD-10-CM

## 2018-05-08 ENCOUNTER — Ambulatory Visit
Admission: RE | Admit: 2018-05-08 | Discharge: 2018-05-08 | Disposition: A | Discharge status: Home / Self Care | Payer: Managed Care, Other (non HMO) | Source: Ambulatory Visit | Attending: Physician Assistant | Admitting: Physician Assistant

## 2018-05-08 ENCOUNTER — Other Ambulatory Visit: Payer: Self-pay | Admitting: Physician Assistant

## 2018-05-08 DIAGNOSIS — M436 Torticollis: Secondary | ICD-10-CM

## 2018-05-08 DIAGNOSIS — M25512 Pain in left shoulder: Secondary | ICD-10-CM

## 2018-12-15 DIAGNOSIS — R2689 Other abnormalities of gait and mobility: Secondary | ICD-10-CM | POA: Insufficient documentation

## 2019-02-04 ENCOUNTER — Telehealth: Payer: Self-pay | Admitting: *Deleted

## 2019-02-04 NOTE — Telephone Encounter (Signed)
LVM advising due to current COVID 19 pandemic, our office is severely reducing in person visits in order to minimize the risk to our patients and healthcare providers. We recommend to convert your appointment to a video visit. Advised we are closed on Fri and need to move her appointment. Requested she call back.

## 2019-02-19 ENCOUNTER — Ambulatory Visit: Payer: Managed Care, Other (non HMO) | Admitting: Diagnostic Neuroimaging

## 2019-03-04 ENCOUNTER — Emergency Department (HOSPITAL_COMMUNITY): Payer: Managed Care, Other (non HMO)

## 2019-03-04 ENCOUNTER — Encounter (HOSPITAL_COMMUNITY): Payer: Self-pay | Admitting: Emergency Medicine

## 2019-03-04 ENCOUNTER — Emergency Department (HOSPITAL_COMMUNITY)
Admission: EM | Admit: 2019-03-04 | Discharge: 2019-03-04 | Disposition: A | Discharge status: Home / Self Care | Payer: Managed Care, Other (non HMO) | Attending: Emergency Medicine | Admitting: Emergency Medicine

## 2019-03-04 ENCOUNTER — Other Ambulatory Visit: Payer: Self-pay

## 2019-03-04 DIAGNOSIS — R42 Dizziness and giddiness: Secondary | ICD-10-CM | POA: Diagnosis not present

## 2019-03-04 DIAGNOSIS — R51 Headache: Secondary | ICD-10-CM | POA: Insufficient documentation

## 2019-03-04 LAB — CBC WITH DIFFERENTIAL/PLATELET
Abs Immature Granulocytes: 0.03 10*3/uL (ref 0.00–0.07)
Basophils Absolute: 0 10*3/uL (ref 0.0–0.1)
Basophils Relative: 0 %
Eosinophils Absolute: 0.1 10*3/uL (ref 0.0–0.5)
Eosinophils Relative: 2 %
HCT: 41.7 % (ref 36.0–46.0)
Hemoglobin: 13.6 g/dL (ref 12.0–15.0)
Immature Granulocytes: 0 %
Lymphocytes Relative: 24 %
Lymphs Abs: 2.2 10*3/uL (ref 0.7–4.0)
MCH: 29.3 pg (ref 26.0–34.0)
MCHC: 32.6 g/dL (ref 30.0–36.0)
MCV: 89.9 fL (ref 80.0–100.0)
Monocytes Absolute: 0.4 10*3/uL (ref 0.1–1.0)
Monocytes Relative: 5 %
Neutro Abs: 6.1 10*3/uL (ref 1.7–7.7)
Neutrophils Relative %: 69 %
Platelets: 341 10*3/uL (ref 150–400)
RBC: 4.64 MIL/uL (ref 3.87–5.11)
RDW: 11.8 % (ref 11.5–15.5)
WBC: 8.9 10*3/uL (ref 4.0–10.5)
nRBC: 0 % (ref 0.0–0.2)

## 2019-03-04 LAB — COMPREHENSIVE METABOLIC PANEL
ALT: 14 U/L (ref 0–44)
AST: 15 U/L (ref 15–41)
Albumin: 3.9 g/dL (ref 3.5–5.0)
Alkaline Phosphatase: 72 U/L (ref 38–126)
Anion gap: 9 (ref 5–15)
BUN: 13 mg/dL (ref 6–20)
CO2: 26 mmol/L (ref 22–32)
Calcium: 9.4 mg/dL (ref 8.9–10.3)
Chloride: 102 mmol/L (ref 98–111)
Creatinine, Ser: 0.82 mg/dL (ref 0.44–1.00)
GFR calc Af Amer: >60 mL/min (ref >60)
GFR calc non Af Amer: >60 mL/min (ref >60)
Glucose, Bld: 91 mg/dL (ref 70–99)
Potassium: 3.7 mmol/L (ref 3.5–5.1)
Sodium: 137 mmol/L (ref 135–145)
Total Bilirubin: 0.6 mg/dL (ref 0.3–1.2)
Total Protein: 7.2 g/dL (ref 6.5–8.1)

## 2019-03-04 MED ORDER — GADOBUTROL 1 MMOL/ML IV SOLN
10.0000 mL | Freq: Once | INTRAVENOUS | Status: AC | PRN
  Administered 2019-03-04: 17:00:00 10 mL via INTRAVENOUS

## 2019-03-04 MED ORDER — SODIUM CHLORIDE 0.9 % IV BOLUS
1000.0000 mL | Freq: Once | INTRAVENOUS | Status: AC
  Administered 2019-03-04: 1000 mL via INTRAVENOUS

## 2019-03-04 NOTE — ED Triage Notes (Signed)
Pt states that she has been dizziness for the past couple of months she has been send to ent and pcp and she was suppose to see neuro

## 2019-03-04 NOTE — ED Provider Notes (Signed)
[facility] EMERGENCY DEPARTMENT Provider Note   CSN: [id number] Arrival date & time: 03/04/19  1345    History   Chief Complaint Chief Complaint  Patient presents with  . Dizziness    HPI Linda Rollins is a 48 y.o. female.     HPI  48 year old female presents with feeling off balance.  Has been ongoing since January.  She has seen her doctor who referred her to ear nose and throat for "an inner ear problem".  However the ENT states it was not ear and could be neurologic.  Has been referred to neuro but because of the current coronavirus pandemic has been unable to get into see them and they cannot see her till August.  The patient states that the off-balance sensation has been worsening and is now constant whenever she tries to walk.  She constantly feels like she is going to fall over to the right.  She has not fallen but she is scared to walk.  She has to walk in a shuffling gait.  There is no weakness or numbness.  She is been having a frontal headache daily recently but has not had a headache overall since January.  Has been having vomiting in the morning.  Has been taking as needed meclizine which sometimes helps.  No chest pain, cough, fever, shortness of breath.  Past Medical History:  Diagnosis Date  . Thyroid disease     Patient Active Problem List   Diagnosis Date Noted  . Unspecified hypothyroidism 01/22/2013  . SHOULDER PAIN 09/04/2009  . CERVICALGIA 09/04/2009  . IMPINGEMENT SYNDROME 09/04/2009  . ANKLE INSTABILITY 08/15/2008    History reviewed. No pertinent surgical history.   OB History    Gravida  2   Para  1   Term      Preterm      AB      Living        SAB      TAB      Ectopic      Multiple      Live Births               Home Medications    Prior to Admission medications   Medication Sig Start Date End Date Taking? Authorizing Provider  cephALEXin (KEFLEX) 500 MG capsule Take 1 capsule (500 mg total) by mouth 4 (four)  times daily. 06/29/17   Burgess AmorIdol, Julie, PA-C  cyclobenzaprine (FLEXERIL) 10 MG tablet Take 1 tablet (10 mg total) by mouth 2 (two) times daily as needed for muscle spasms. 04/03/15   Donnetta Hutchingook, Brian, MD  ibuprofen (ADVIL,MOTRIN) 200 MG tablet Take 400 mg by mouth every 6 (six) hours as needed for moderate pain.    [provider]  levothyroxine (SYNTHROID, LEVOTHROID) 125 MCG tablet Take 1 tablet (125 mcg total) by mouth daily. Patient taking differently: Take 150 mcg by mouth daily.  05/25/13   Daphine DeutscherMartin, Mary-Margaret, FNP  naproxen (NAPROSYN) 500 MG tablet Take 1 tablet (500 mg total) by mouth 2 (two) times daily. 04/03/15   Donnetta Hutchingook, Brian, MD    Family History No family history on file.  Social History Social History   Tobacco Use  . Smoking status: Never Smoker  . Smokeless tobacco: Never Used  Substance Use Topics  . Alcohol use: No  . Drug use: No     Allergies   Patient has no known allergies.   Review of Systems Review of Systems  Constitutional: Negative for fever.  Eyes:  Positive for visual disturbance (blurry vision).  Respiratory: Negative for cough and shortness of breath.   Cardiovascular: Negative for chest pain.  Gastrointestinal: Positive for vomiting.  Neurological: Positive for dizziness and headaches. Negative for weakness and numbness.  All other systems reviewed and are negative.    Physical Exam Updated Vital Signs BP 134/80 (BP Location: Left Arm)   Pulse 80   Temp 97.8 F (36.6 C) (Oral)   Resp 20   Ht  (1.626 m)   Wt 114.3 kg   LMP 06/29/2017   SpO2 100%   BMI 43.26 kg/m   Physical Exam Vitals signs and nursing note reviewed.  Constitutional:      General: She is not in acute distress.    Appearance: She is well-developed. She is obese. She is not ill-appearing or diaphoretic.  HENT:     Head: Normocephalic and atraumatic.     Right Ear: External ear normal.     Left Ear: External ear normal.     Nose: Nose normal.  Eyes:      General:        Right eye: No discharge.        Left eye: No discharge.     Extraocular Movements: Extraocular movements intact.     Pupils: Pupils are equal, round, and reactive to light.     Comments: Possibly some mild nystagmus but hard to tell  Cardiovascular:     Rate and Rhythm: Normal rate and regular rhythm.     Heart sounds: Normal heart sounds.  Pulmonary:     Effort: Pulmonary effort is normal.     Breath sounds: Normal breath sounds.  Abdominal:     Palpations: Abdomen is soft.     Tenderness: There is no abdominal tenderness.  Skin:    General: Skin is warm and dry.  Neurological:     Mental Status: She is alert.     Comments: CN 3-12 grossly intact. 5/5 strength in all 4 extremities. Grossly normal sensation. Normal finger to nose. Ambulates with slow shuffling gait  Psychiatric:        Mood and Affect: Mood is not anxious.      ED Treatments / Results  Labs (all labs ordered are listed, but only abnormal results are displayed) Labs Reviewed  CBC WITH DIFFERENTIAL/PLATELET  COMPREHENSIVE METABOLIC PANEL    EKG EKG Interpretation  Date/Time:  Thursday Mar 04 2019 16:22:58 EDT Ventricular Rate:  84 PR Interval:    QRS Duration: 105 QT Interval:  388 QTC Calculation: 459 R Axis:   -3 Text Interpretation:  Sinus rhythm Probable left ventricular hypertrophy no acute ST/T changes no significant change since Feb 2015 Confirmed by Pricilla Loveless [phone]) on 03/04/2019 4:51:17 PM   Radiology Mr Brain W And Wo Contrast  Result Date: 03/04/2019 CLINICAL DATA:  Initial evaluation for dizziness for several months. EXAM: MRI HEAD WITHOUT AND WITH CONTRAST TECHNIQUE: Multiplanar, multiecho pulse sequences of the brain and surrounding structures were obtained without and with intravenous contrast. CONTRAST:  10 cc of Gadavist. COMPARISON:  None. FINDINGS: Brain: Cerebral volume within normal limits for patient age. No focal parenchymal signal abnormality identified. No  abnormal foci of restricted diffusion to suggest acute or subacute ischemia. Gray-white matter differentiation well maintained. No encephalomalacia to suggest chronic infarction. No foci of susceptibility artifact to suggest acute or chronic intracranial hemorrhage. No mass lesion, midline shift or mass effect. No hydrocephalus. No extra-axial fluid collection. Major dural sinuses are grossly patent. Pituitary gland  and suprasellar region are normal. Midline structures intact and normal. No abnormal enhancement. Vascular: Major intracranial vascular flow voids well maintained and normal in appearance. Skull and upper cervical spine: Craniocervical junction normal. Visualized upper cervical spine within normal limits. Bone marrow signal intensity normal. No scalp soft tissue abnormality. Sinuses/Orbits: Globes and orbital soft tissues within normal limits. Left sphenoid sinus retention cyst noted. Paranasal sinuses are otherwise largely clear. No mastoid effusion. Inner ear structures normal. Other: None. IMPRESSION: Normal brain MRI for age. No acute intracranial abnormality or findings to explain patient's symptoms identified. Electronically Signed   By: Rise Mu M.D.   On: 03/04/2019 18:11    Procedures Procedures (including critical care time)  Medications Ordered in ED Medications  sodium chloride 0.9 % bolus 1,000 mL (1,000 mLs Intravenous New Bag/Given 03/04/19 1747)  gadobutrol (GADAVIST) 1 MMOL/ML injection 10 mL (10 mLs Intravenous Contrast Given 03/04/19 1634)     Initial Impression / Assessment and Plan / ED Course  I have reviewed the triage vital signs and the nursing notes.  Pertinent labs & imaging results that were available during my care of the patient were reviewed by me and considered in my medical decision making (see chart for details).        Given the patient's daily headache, vomiting, and feeling of being off balance, MRI was obtained to rule out infarct or  lesion/mass.  This MRI with and without contrast is benign.  Labs are overall reassuring.  Given the length of time of her symptoms, I doubt acute CNS infection.  I will recommend she continue to use her Antivert as needed and will refer her to neurology in [place] as it appears that she is can have a hard time getting into see the one in [place] that she was referred to.  Discharged with return precautions.  Final Clinical Impressions(s) / ED Diagnoses   Final diagnoses:  Vertigo    ED Discharge Orders         Ordered    Ambulatory referral to Neurology    Comments:  An appointment is requested in approximately: 2 weeks   03/04/19 1854           Pricilla Loveless, MD 03/04/19 1919

## 2019-03-25 NOTE — Telephone Encounter (Signed)
Attempted to reach patient on mobile # to discuss converting her appt on Fri 04/09/19 to video visit and rescheduling to another day. No answer, unable to LVM.

## 2019-03-30 NOTE — Telephone Encounter (Signed)
LVM #2 requesting call back to reschedule. Advised her the office is closed on Fri due to Covid 19.

## 2019-04-05 ENCOUNTER — Telehealth: Payer: Self-pay | Admitting: Diagnostic Neuroimaging

## 2019-04-05 NOTE — Telephone Encounter (Signed)
°  Due to current COVID 19 pandemic, our office is severely reducing in office visits until further notice, in order to minimize the risk to our patients and healthcare providers.    Called patient and scheduled a virtual visit with Dr. Leta Baptist for 6/22. Patient verbalized understanding of the [email or web address] process and I have sent an e-mail to [email or web address] with link and instructions as well as my name and our office number. Patient understands that they will receive a call from RN to update chart.   Pt understands that although there may be some limitations with this type of visit, we will take all precautions to reduce any security or privacy concerns.  Pt understands that this will be treated like an in office visit and we will file with pt's insurance, and there may be a patient responsible charge related to this service.

## 2019-04-09 ENCOUNTER — Ambulatory Visit: Payer: Managed Care, Other (non HMO) | Admitting: Diagnostic Neuroimaging

## 2019-04-14 ENCOUNTER — Encounter: Payer: Self-pay | Admitting: Diagnostic Neuroimaging

## 2019-04-14 NOTE — Telephone Encounter (Signed)
Called patient and LVM requesting call back to update EMR.  

## 2019-04-14 NOTE — Addendum Note (Signed)
Addended by: Minna Antis on: 04/14/2019 03:17 PM   Modules accepted: Orders

## 2019-04-19 ENCOUNTER — Ambulatory Visit: Payer: Managed Care, Other (non HMO) | Admitting: Diagnostic Neuroimaging

## 2019-04-19 ENCOUNTER — Other Ambulatory Visit: Payer: Self-pay

## 2019-05-12 ENCOUNTER — Encounter: Payer: Self-pay | Admitting: Neurology

## 2019-05-12 ENCOUNTER — Other Ambulatory Visit: Payer: Self-pay

## 2019-05-12 ENCOUNTER — Ambulatory Visit: Payer: Managed Care, Other (non HMO) | Admitting: Neurology

## 2019-05-12 VITALS — Temp 97.8°F | Ht 63.0 in | Wt 255.0 lb

## 2019-05-12 DIAGNOSIS — R300 Dysuria: Secondary | ICD-10-CM

## 2019-05-12 DIAGNOSIS — R42 Dizziness and giddiness: Secondary | ICD-10-CM | POA: Diagnosis not present

## 2019-05-12 DIAGNOSIS — I951 Orthostatic hypotension: Secondary | ICD-10-CM | POA: Diagnosis not present

## 2019-05-12 NOTE — Patient Instructions (Signed)
Orthostatic Hypotension °Blood pressure is a measurement of how strongly, or weakly, your blood is pressing against the walls of your arteries. Orthostatic hypotension is a sudden drop in blood pressure that happens when you quickly change positions, such as when you get up from sitting or lying down. °Arteries are blood vessels that carry blood from your heart throughout your body. When blood pressure is too low, you may not get enough blood to your brain or to the rest of your organs. This can cause weakness, light-headedness, rapid heartbeat, and fainting. This can last for just a few seconds or for up to a few minutes. Orthostatic hypotension is usually not a serious problem. However, if it happens frequently or gets worse, it may be a sign of something more serious. °What are the causes? °This condition may be caused by: °· Sudden changes in posture, such as standing up quickly after you have been sitting or lying down. °· Blood loss. °· Loss of body fluids (dehydration). °· Heart problems. °· Hormone (endocrine) problems. °· Pregnancy. °· Severe infection. °· Lack of certain nutrients. °· Severe allergic reactions (anaphylaxis). °· Certain medicines, such as blood pressure medicine or medicines that make the body lose excess fluids (diuretics). Sometimes, this condition can be caused by not taking medicine as directed, such as taking too much of a certain medicine. °What increases the risk? °The following factors may make you more likely to develop this condition: °· Age. Risk increases as you get older. °· Conditions that affect the heart or the central nervous system. °· Taking certain medicines, such as blood pressure medicine or diuretics. °· Being pregnant. °What are the signs or symptoms? °Symptoms of this condition may include: °· Weakness. °· Light-headedness. °· Dizziness. °· Blurred vision. °· Fatigue. °· Rapid heartbeat. °· Fainting, in severe cases. °How is this diagnosed? °This condition is  diagnosed based on: °· Your medical history. °· Your symptoms. °· Your blood pressure measurement. Your health care provider will check your blood pressure when you are: °? Lying down. °? Sitting. °? Standing. °A blood pressure reading is recorded as two numbers, such as "120 over 80" (or 120/80). The first ("top") number is called the systolic pressure. It is a measure of the pressure in your arteries as your heart beats. The second ("bottom") number is called the diastolic pressure. It is a measure of the pressure in your arteries when your heart relaxes between beats. Blood pressure is measured in a unit called mm Hg. Healthy blood pressure for most adults is 120/80. If your blood pressure is below 90/60, you may be diagnosed with hypotension. °Other information or tests that may be used to diagnose orthostatic hypotension include: °· Your other vital signs, such as your heart rate and temperature. °· Blood tests. °· Tilt table test. For this test, you will be safely secured to a table that moves you from a lying position to an upright position. Your heart rhythm and blood pressure will be monitored during the test. °How is this treated? °This condition may be treated by: °· Changing your diet. This may involve eating more salt (sodium) or drinking more water. °· Taking medicines to raise your blood pressure. °· Changing the dosage of certain medicines you are taking that might be lowering your blood pressure. °· Wearing compression stockings. These stockings help to prevent blood clots and reduce swelling in your legs. °In some cases, you may need to go to the hospital for: °· Fluid replacement. This means you will   receive fluids through an IV. °· Blood replacement. This means you will receive donated blood through an IV (transfusion). °· Treating an infection or heart problems, if this applies. °· Monitoring. You may need to be monitored while medicines that you are taking wear off. °Follow these instructions  at home: °Eating and drinking ° °· Drink enough fluid to keep your urine pale yellow. °· Eat a healthy diet, and follow instructions from your health care provider about eating or drinking restrictions. A healthy diet includes: °? Fresh fruits and vegetables. °? Whole grains. °? Lean meats. °? Low-fat dairy products. °· Eat extra salt only as directed. Do not add extra salt to your diet unless your health care provider told you to do that. °· Eat frequent, small meals. °· Avoid standing up suddenly after eating. °Medicines °· Take over-the-counter and prescription medicines only as told by your health care provider. °? Follow instructions from your health care provider about changing the dosage of your current medicines, if this applies. °? Do not stop or adjust any of your medicines on your own. °General instructions ° °· Wear compression stockings as told by your health care provider. °· Get up slowly from lying down or sitting positions. This gives your blood pressure a chance to adjust. °· Avoid hot showers and excessive heat as directed by your health care provider. °· Return to your normal activities as told by your health care provider. Ask your health care provider what activities are safe for you. °· Do not use any products that contain nicotine or tobacco, such as cigarettes, e-cigarettes, and chewing tobacco. If you need help quitting, ask your health care provider. °· Keep all follow-up visits as told by your health care provider. This is important. °Contact a health care provider if you: °· Vomit. °· Have diarrhea. °· Have a fever for more than 2-3 days. °· Feel more thirsty than usual. °· Feel weak and tired. °Get help right away if you: °· Have chest pain. °· Have a fast or irregular heartbeat. °· Develop numbness in any part of your body. °· Cannot move your arms or your legs. °· Have trouble speaking. °· Become sweaty or feel light-headed. °· Faint. °· Feel short of breath. °· Have trouble staying  awake. °· Feel confused. °Summary °· Orthostatic hypotension is a sudden drop in blood pressure that happens when you quickly change positions. °· Orthostatic hypotension is usually not a serious problem. °· It is diagnosed by having your blood pressure taken lying down, sitting, and then standing. °· It may be treated by changing your diet or adjusting your medicines. °This information is not intended to replace advice given to you by your health care provider. Make sure you discuss any questions you have with your health care provider. °Document Released: 10/04/2002 Document Revised: 04/09/2018 Document Reviewed: 04/09/2018 °[employer] Patient Education © 2020 [employer] ° °

## 2019-05-12 NOTE — Progress Notes (Signed)
[facility]    Provider:  Dr Lucia GaskinsAhern Requesting Provider: Sammuel Hineswight David Bates, MD otolaryngology Primary Care Provider:  [place]  CC:  Lightheadedness, dizziness  HPI:  Linda CarlsLeticia M Rollins is a 48 y.o. female here as requested by Dr. Jenne PaneBates for Imbalance. Started in January she was at her niece's basketball game and she got dizzy when she stood up.  She took some meclizine and it helped. She was vomiting every day. It only happened only when standing fine when sitting but worse when standing or changing positions. She cannot walk up the steps or down the steps. She feels better sitting down immediately. She feels lightheaded like she is going to pass out. She cant walk a straight line. No spinning.  It has happened when laying down. She feels sweaty, her vision gets blurry. She denies having headaches or migraines. She is drinking a lot of water. She is urinating all day. She was in the emergency room and felt dizzy, sweaty, like she was going to pass out and had to sit down and felt better. She is urinating every 2 hours even the days she has not had much fluid.   Reviewed notes, labs and imaging from outside physicians, which showed:  I reviewed notes.  From Dr. Sammuel Hineswight David Bates, MD.  This is a 48 year old female who presented with a chief complaint of dizziness.  In January of this year she developed dizziness suddenly while watching a basketball game.  She did not feel spinning but could not get up and walk.  Before that happened she felt off balance.  She did not feel like she was going to pass out.  She went home and felt better.  Before that and more so after she would wake up every morning with nausea and vomiting.  She felt less off balance and was able to work.  She saw her doctor who prescribed meclizine and amoxicillin for possible ear infection.  She stopped throwing up when she started the meclizine.  She was doing much better.  About 9  days prior to the appointment dated December 15, 2018 she had another bad episode at a grocery store.  Her vision was blurry and she felt lightheaded.  She was sweating.  She waited about an hour and then felt better and drove home.  She felt that each morning I did not work the prior week to her appointment.  She denies any ear specific symptoms such as ear pain, ringing or hearing loss.  Exam was normal her dizziness is not typical of an inner ear cause of dizziness.  He suspected a neurologic cause possibly migraine.  MRI brain  showed No acute intracranial abnormalities including mass lesion or mass effect, hydrocephalus, extra-axial fluid collection, midline shift, hemorrhage, or acute infarction, large ischemic events (personally reviewed images)   Review of Systems: Patient complains of symptoms per HPI as well as the following symptoms: dizzy. Pertinent negatives and positives per HPI. All others negative.   Social History   Socioeconomic History   Marital status: Single    Spouse name: Not on file   Number of children: 1   Years of education: Not on file   Highest education level: Some college, no degree  Occupational History   Not on file  Social Needs   Financial resource strain: Not on file   Food insecurity    Worry: Not on file    Inability: Not on file   Transportation needs  Medical: Not on file    Non-medical: Not on file  Tobacco Use   Smoking status: Never Smoker   Smokeless tobacco: Never Used  Substance and Sexual Activity   Alcohol use: Never    Frequency: Never   Drug use: Never   Sexual activity: Not on file  Lifestyle   Physical activity    Days per week: Not on file    Minutes per session: Not on file   Stress: Not on file  Relationships   Social connections    Talks on phone: Not on file    Gets together: Not on file    Attends religious service: Not on file    Active member of club or organization: Not on file    Attends  meetings of clubs or organizations: Not on file    Relationship status: Not on file   Intimate partner violence    Fear of current or ex partner: Not on file    Emotionally abused: Not on file    Physically abused: Not on file    Forced sexual activity: Not on file  Other Topics Concern   Not on file  Social History Narrative   Lives at home with her son   Right handed   Caffeine: no    Family History  Problem Relation Age of Onset   Pulmonary embolism Mother    High blood pressure Other        father's side   Diabetes Other        mother's side   Heart disease Other        mother's side    Past Medical History:  Diagnosis Date   Dizziness    Thyroid disease    Vertigo     Patient Active Problem List   Diagnosis Date Noted   Orthostasis 05/17/2019   Unspecified hypothyroidism 01/22/2013   SHOULDER PAIN 09/04/2009   CERVICALGIA 09/04/2009   IMPINGEMENT SYNDROME 09/04/2009   ANKLE INSTABILITY 08/15/2008    Past Surgical History:  Procedure Laterality Date   cortisone shots      [facility]; Dr. Doran Durand (feet) and Dr. Veverly Fells (knees)   DENTAL SURGERY     wisdom teeth removal   LEEP      Current Outpatient Medications  Medication Sig Dispense Refill   ibuprofen (ADVIL,MOTRIN) 200 MG tablet Take 600 mg by mouth daily as needed for moderate pain.      levothyroxine (SYNTHROID) 175 MCG tablet 175 mcg daily.     meclizine (ANTIVERT) 25 MG tablet TAKE ONE PILL UP TO THREE TIMES PER DAY FOR DIZZINESS     sulfamethoxazole-trimethoprim (BACTRIM DS) 800-160 MG tablet Take 1 tablet by mouth 2 (two) times a day. For 10 days.     No current facility-administered medications for this visit.     Allergies as of 05/12/2019   (No Known Allergies)    Vitals: Temp 97.8 F (36.6 C) Comment: taken by front staff   Ht 5\' 3"  (1.6 m)    Wt 255 lb (115.7 kg)    LMP 06/29/2017    BMI 45.17 kg/m  Last Weight:  Wt Readings from Last 1  Encounters:  05/12/19 255 lb (115.7 kg)   Last Height:   Ht Readings from Last 1 Encounters:  05/12/19 5\' 3"  (1.6 m)   Wide based and small steps with imbalance.  2+ reflexes Otherwise intact  Physical exam: Exam: Gen: NAD, conversant, well nourised, obese, well groomed  CV: RRR, no MRG. No Carotid Bruits. No peripheral edema, warm, nontender Eyes: Conjunctivae clear without exudates or hemorrhage  Neuro: Detailed Neurologic Exam  Speech:    Speech is normal; fluent and spontaneous with normal comprehension.  Cognition:    The patient is oriented to person, place, and time;     recent and remote memory intact;     language fluent;     normal attention, concentration,     fund of knowledge Cranial Nerves:    The pupils are equal, round, and reactive to light. The fundi are normal and spontaneous venous pulsations are present. Visual fields are full to finger confrontation. Extraocular movements are intact. Trigeminal sensation is intact and the muscles of mastication are normal. The face is symmetric. The palate elevates in the midline. Hearing intact. Voice is normal. Shoulder shrug is normal. The tongue has normal motion without fasciculations.   Coordination:    Normal finger to nose and heel to shin. Normal rapid alternating movements.   Gait:    Heel-toe and tandem gait are normal.   Motor Observation:    No asymmetry, no atrophy, and no involuntary movements noted. Tone:    Normal muscle tone.    Posture:    Posture is normal. normal erect    Strength:    Strength is V/V in the upper and lower limbs.      Sensation: intact to LT     Reflex Exam:  DTR's:    Deep tendon reflexes in the upper and lower extremities are normal bilaterally.   Toes:    The toes are downgoing bilaterally.   Clonus:    Clonus is absent.    Assessment/Plan: This is a 48 year old patient who presents for dizziness and lightheadedness, tachycardia, diaphoresis  when changing positions.  Very much sounds orthostatic to me.  MRI of the brain was unremarkable I cannot identify any primary neurologic disorder and I do not think this is related to her migraine disorder.  Today on examination we were able to reproduce the symptoms after patient standing for 3 minutes, her blood pressure decreased by close to 20 points systolic and 10 points diastolic and her pulse increased to 122 bpm.  I will check some lab work today however sounds at this may be cardiac or other etiology.  I did call [facility] provider at [facility] and left a message I will try again. She may need cardiology or a holter monitor.  Sitting 138/86, standing 139/97 standing 3 minutes 120/90 and pulse 122 with reproduction of symptoms.  Orders Placed This Encounter  Procedures   Comprehensive metabolic panel   CBC   Hemoglobin A1c   TSH   Urinalysis, Routine w reflex microscopic    Cc: [place] [place]  [place]  Naomie DeanAntonia Merilee Wible, MD  [facility] [address] [place], [place] [id number]  Phone [phone] Fax [phone]

## 2019-05-13 ENCOUNTER — Telehealth: Payer: Self-pay | Admitting: Neurology

## 2019-05-13 LAB — COMPREHENSIVE METABOLIC PANEL
ALT: 14 IU/L (ref 0–32)
AST: 15 IU/L (ref 0–40)
Albumin/Globulin Ratio: 1.6 (ref 1.2–2.2)
Albumin: 4.1 g/dL (ref 3.8–4.8)
Alkaline Phosphatase: 83 IU/L (ref 39–117)
BUN/Creatinine Ratio: 15 (ref 9–23)
BUN: 14 mg/dL (ref 6–24)
Bilirubin Total: 0.3 mg/dL (ref 0.0–1.2)
CO2: 21 mmol/L (ref 20–29)
Calcium: 9.6 mg/dL (ref 8.7–10.2)
Chloride: 102 mmol/L (ref 96–106)
Creatinine, Ser: 0.95 mg/dL (ref 0.57–1.00)
GFR calc Af Amer: 81 mL/min/{1.73_m2} (ref >59)
GFR calc non Af Amer: 71 mL/min/{1.73_m2} (ref >59)
Globulin, Total: 2.6 g/dL (ref 1.5–4.5)
Glucose: 93 mg/dL (ref 65–99)
Potassium: 4.4 mmol/L (ref 3.5–5.2)
Sodium: 140 mmol/L (ref 134–144)
Total Protein: 6.7 g/dL (ref 6.0–8.5)

## 2019-05-13 LAB — CBC
Hematocrit: 39.8 % (ref 34.0–46.6)
Hemoglobin: 13.2 g/dL (ref 11.1–15.9)
MCH: 28.5 pg (ref 26.6–33.0)
MCHC: 33.2 g/dL (ref 31.5–35.7)
MCV: 86 fL (ref 79–97)
Platelets: 381 10*3/uL (ref 150–450)
RBC: 4.63 x10E6/uL (ref 3.77–5.28)
RDW: 11.8 % (ref 11.7–15.4)
WBC: 12.1 10*3/uL — ABNORMAL HIGH (ref 3.4–10.8)

## 2019-05-13 LAB — URINALYSIS, ROUTINE W REFLEX MICROSCOPIC
Bilirubin, UA: NEGATIVE
Glucose, UA: NEGATIVE
Leukocytes,UA: NEGATIVE
Nitrite, UA: NEGATIVE
RBC, UA: NEGATIVE
Specific Gravity, UA: >=1.03 — AB (ref 1.005–1.030)
Urobilinogen, Ur: 1 mg/dL (ref 0.2–1.0)
pH, UA: 5.5 (ref 5.0–7.5)

## 2019-05-13 LAB — TSH: TSH: 1.63 u[IU]/mL (ref 0.450–4.500)

## 2019-05-13 LAB — HEMOGLOBIN A1C
Est. average glucose Bld gHb Est-mCnc: 88 mg/dL
Hgb A1c MFr Bld: 4.7 % — ABNORMAL LOW (ref 4.8–5.6)

## 2019-05-13 NOTE — Telephone Encounter (Signed)
I spoke with the patient and gave her Dr. Cathren Laine entire message below. She verbalized understanding. Her questions were answered.

## 2019-05-13 NOTE — Telephone Encounter (Signed)
Labs were normal except for elevated white blood cells to 12 and her urine appears concentrated and cloudy however doesn't appear like she has an infection it looks like she mat be dehydrated. Thyroid and other labs normal, she is not diabetic at all nit even close. Unfortunately given the tachycardia of 122 on orthostatics yesterday and the drop in blood pressure while standing I think this has something to do with that but this is out of my area of expertise. I have left a message at [facility] and awaiting a call back. If she has worsening symptoms she should go to the emergency room. I will let her know if I hear from her pcp.

## 2019-05-17 DIAGNOSIS — I951 Orthostatic hypotension: Secondary | ICD-10-CM | POA: Insufficient documentation

## 2019-05-19 ENCOUNTER — Telehealth: Payer: Self-pay | Admitting: Neurology

## 2019-05-19 NOTE — Telephone Encounter (Signed)
Pt would like to know the status of the cardiologist referral that was to be put in for her. Please advise.

## 2019-05-19 NOTE — Telephone Encounter (Signed)
Update Dr. Jaynee Eagles is speaking with pt's PCP and they will be referring the patient to cardiology and that they had already spoken with the patient.

## 2019-05-19 NOTE — Telephone Encounter (Signed)
I would like the pcp to refer her but I will call them again.

## 2019-06-14 ENCOUNTER — Ambulatory Visit: Payer: Self-pay | Admitting: Cardiology

## 2019-07-13 ENCOUNTER — Other Ambulatory Visit: Payer: Self-pay | Admitting: Family Medicine

## 2019-07-13 DIAGNOSIS — Z1231 Encounter for screening mammogram for malignant neoplasm of breast: Secondary | ICD-10-CM

## 2019-07-20 ENCOUNTER — Telehealth: Payer: Self-pay | Admitting: Neurology

## 2019-07-20 NOTE — Telephone Encounter (Signed)
Get her a follow up with Amy please. I do not believe this is neurologic based on symptoms. Thanks.

## 2019-07-20 NOTE — Telephone Encounter (Signed)
Also, I do not see cardiology notes. She needs to get cardiology records to [place] before we will see her again. I do not believe this is neurologic in etiology. I would like Amy's thoughts. thanks thanks

## 2019-07-20 NOTE — Telephone Encounter (Signed)
Spoke with pt. She will have cardiology send records over here. Pt will call office tomorrow AM when she can write down the information for contact. Pt stated cardiology cleared her. She stated she had an ECHO, wore a heart monitor, spent time in hospital. She is better as far as dizziness and said she feels good, has no pain. However, she cannot walk a straight line, cannot go up steps, cannot "control" her body. She stated she tries to go one way and her body goes another. This was happening previously as well. She asked if Dr. Jaynee Eagles could also look over her May MRI brain again. She verbalized appreciation for the call.

## 2019-07-20 NOTE — Telephone Encounter (Signed)
This pt was here in July and saw Dr Jaynee Eagles. Dr Jaynee Eagles wanted her to see a cardiologist and she has and the cardiologist cleared her and said she needs to come back to see Dr Jaynee Eagles. She is still having balance problems. She wants a call back. Her number in the system is the number to call.

## 2019-08-27 ENCOUNTER — Ambulatory Visit: Payer: Managed Care, Other (non HMO)

## 2019-10-08 DIAGNOSIS — R42 Dizziness and giddiness: Secondary | ICD-10-CM | POA: Insufficient documentation

## 2019-10-21 DIAGNOSIS — H8111 Benign paroxysmal vertigo, right ear: Secondary | ICD-10-CM | POA: Insufficient documentation

## 2019-10-21 DIAGNOSIS — R269 Unspecified abnormalities of gait and mobility: Secondary | ICD-10-CM | POA: Insufficient documentation

## 2019-11-17 ENCOUNTER — Ambulatory Visit: Payer: Managed Care, Other (non HMO)

## 2019-12-20 ENCOUNTER — Ambulatory Visit: Payer: Managed Care, Other (non HMO)

## 2019-12-29 ENCOUNTER — Ambulatory Visit: Payer: Managed Care, Other (non HMO)

## 2020-01-28 ENCOUNTER — Ambulatory Visit
Admission: RE | Admit: 2020-01-28 | Discharge: 2020-01-28 | Disposition: A | Discharge status: Home / Self Care | Payer: Managed Care, Other (non HMO) | Source: Ambulatory Visit | Attending: Family Medicine | Admitting: Family Medicine

## 2020-01-28 ENCOUNTER — Other Ambulatory Visit: Payer: Self-pay

## 2020-01-28 DIAGNOSIS — Z1231 Encounter for screening mammogram for malignant neoplasm of breast: Secondary | ICD-10-CM

## 2020-02-02 DIAGNOSIS — G0489 Other myelitis: Secondary | ICD-10-CM | POA: Insufficient documentation

## 2020-02-02 DIAGNOSIS — G959 Disease of spinal cord, unspecified: Secondary | ICD-10-CM | POA: Insufficient documentation

## 2020-02-02 DIAGNOSIS — E063 Autoimmune thyroiditis: Secondary | ICD-10-CM | POA: Insufficient documentation

## 2020-07-07 ENCOUNTER — Ambulatory Visit: Payer: Managed Care, Other (non HMO) | Admitting: Allergy & Immunology

## 2020-11-06 DIAGNOSIS — E063 Autoimmune thyroiditis: Secondary | ICD-10-CM | POA: Insufficient documentation

## 2020-12-21 ENCOUNTER — Other Ambulatory Visit: Payer: Self-pay | Admitting: Family Medicine

## 2020-12-21 DIAGNOSIS — Z1231 Encounter for screening mammogram for malignant neoplasm of breast: Secondary | ICD-10-CM

## 2021-02-09 ENCOUNTER — Inpatient Hospital Stay: Admission: RE | Admit: 2021-02-09 | Payer: Managed Care, Other (non HMO) | Source: Ambulatory Visit

## 2021-03-30 ENCOUNTER — Ambulatory Visit: Payer: Managed Care, Other (non HMO)

## 2021-04-04 ENCOUNTER — Ambulatory Visit: Payer: Managed Care, Other (non HMO)

## 2021-04-10 ENCOUNTER — Ambulatory Visit: Payer: Managed Care, Other (non HMO)

## 2021-04-12 ENCOUNTER — Other Ambulatory Visit: Payer: Self-pay

## 2021-04-12 ENCOUNTER — Ambulatory Visit
Admission: RE | Admit: 2021-04-12 | Discharge: 2021-04-12 | Disposition: A | Discharge status: Home / Self Care | Payer: Managed Care, Other (non HMO) | Source: Ambulatory Visit | Attending: Family Medicine | Admitting: Family Medicine

## 2021-04-12 DIAGNOSIS — Z1231 Encounter for screening mammogram for malignant neoplasm of breast: Secondary | ICD-10-CM

## 2021-04-16 ENCOUNTER — Other Ambulatory Visit: Payer: Self-pay | Admitting: Family Medicine

## 2021-04-16 DIAGNOSIS — R928 Other abnormal and inconclusive findings on diagnostic imaging of breast: Secondary | ICD-10-CM

## 2021-05-08 ENCOUNTER — Ambulatory Visit
Admission: RE | Admit: 2021-05-08 | Discharge: 2021-05-08 | Disposition: A | Discharge status: Home / Self Care | Payer: Managed Care, Other (non HMO) | Source: Ambulatory Visit | Attending: Family Medicine | Admitting: Family Medicine

## 2021-05-08 ENCOUNTER — Other Ambulatory Visit: Payer: Self-pay

## 2021-05-08 DIAGNOSIS — R928 Other abnormal and inconclusive findings on diagnostic imaging of breast: Secondary | ICD-10-CM

## 2022-01-05 IMAGING — US US BREAST*R* LIMITED INC AXILLA
1 series · 7 of 7 positions shown · non-contrast
Comparison: Previous exam(s).

CLINICAL DATA: Patient returns after screening study for evaluation
possible focal asymmetry in the RIGHT breast.

EXAM:
DIGITAL DIAGNOSTIC UNILATERAL RIGHT MAMMOGRAM WITH TOMOSYNTHESIS AND
CAD; ULTRASOUND RIGHT BREAST LIMITED
TECHNIQUE: Right digital diagnostic mammography and breast tomosynthesis was
performed. The images were evaluated with computer-aided detection.;
Targeted ultrasound examination of the right breast was performed

[Series 1: us breast*right* limited inc axilla · 0.06mm/px · 7 of 7 slices shown]
[im 1/7]
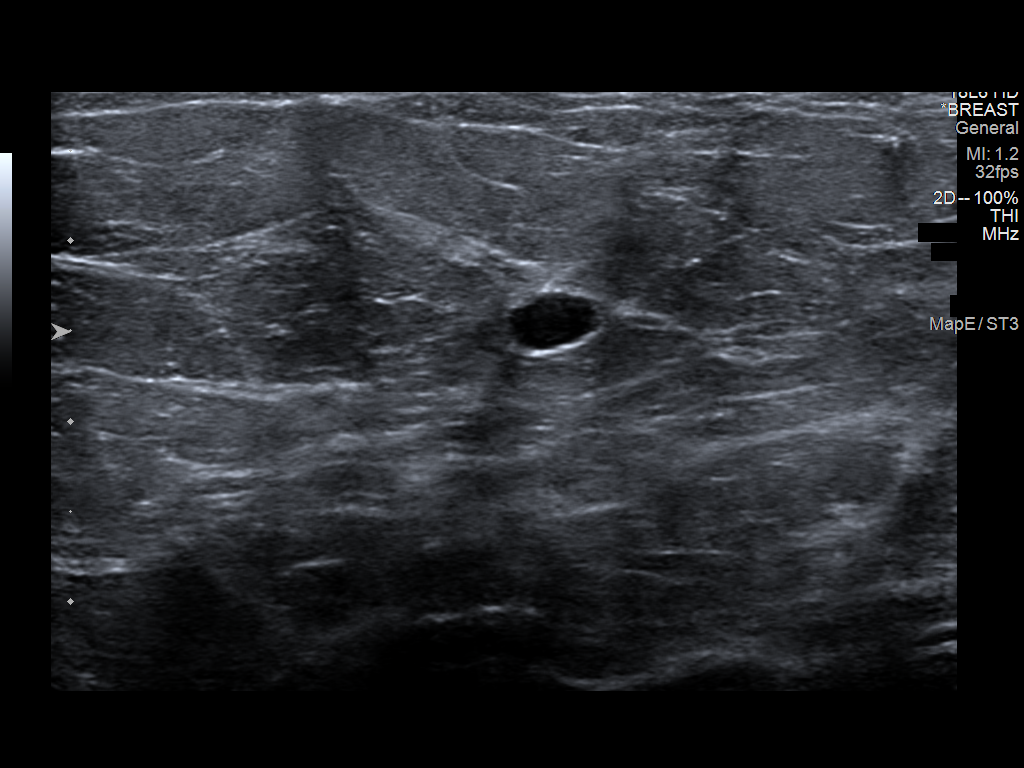
[im 2/7]
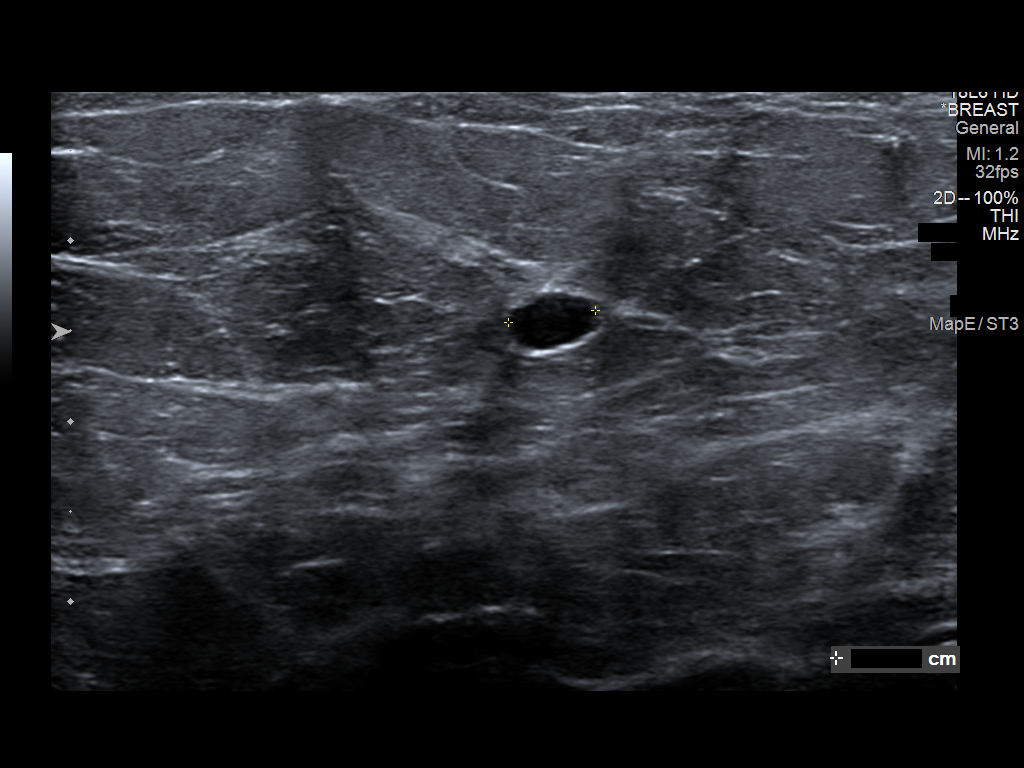
[im 3/7]
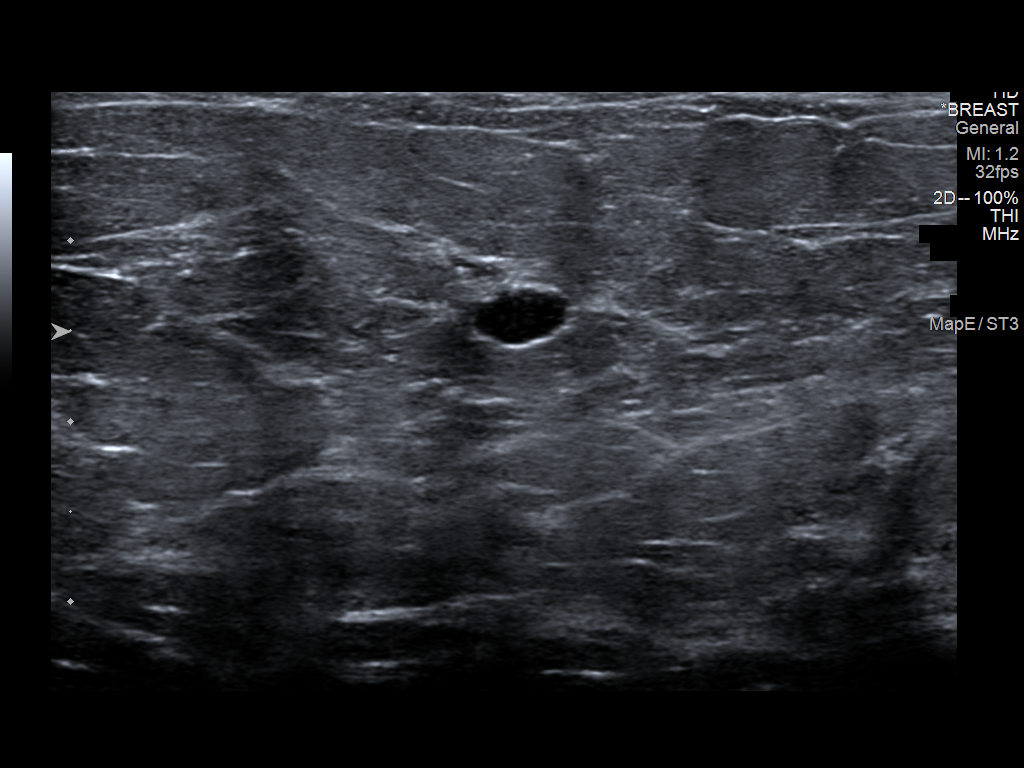
[im 4/7]
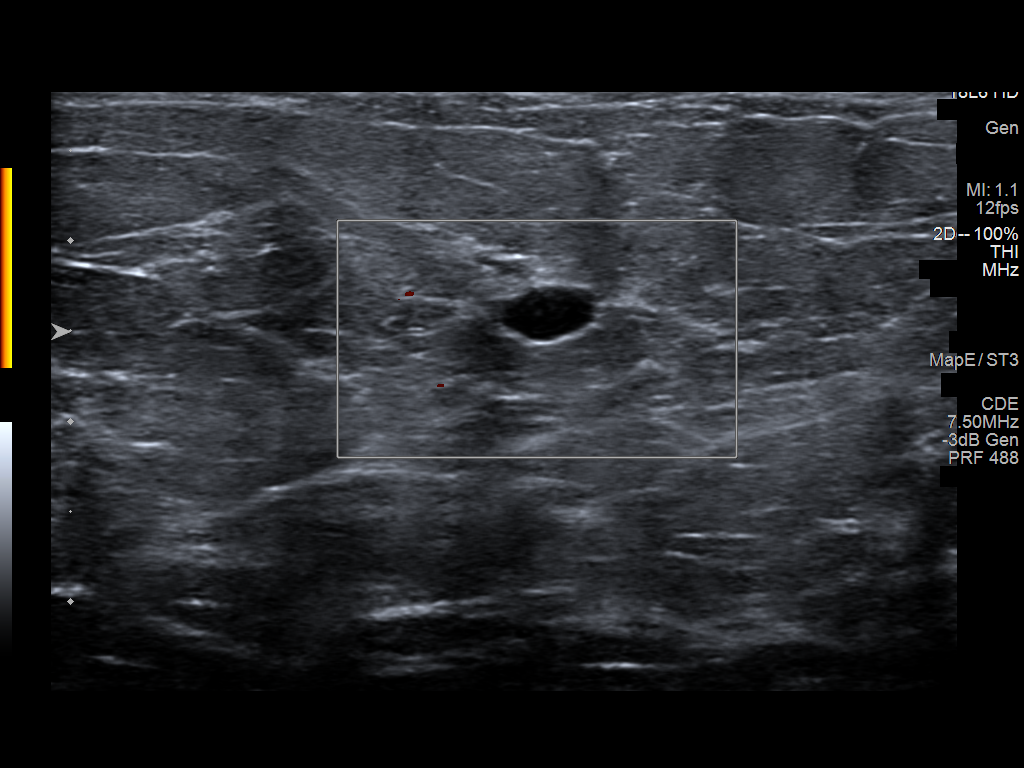
[im 5/7]
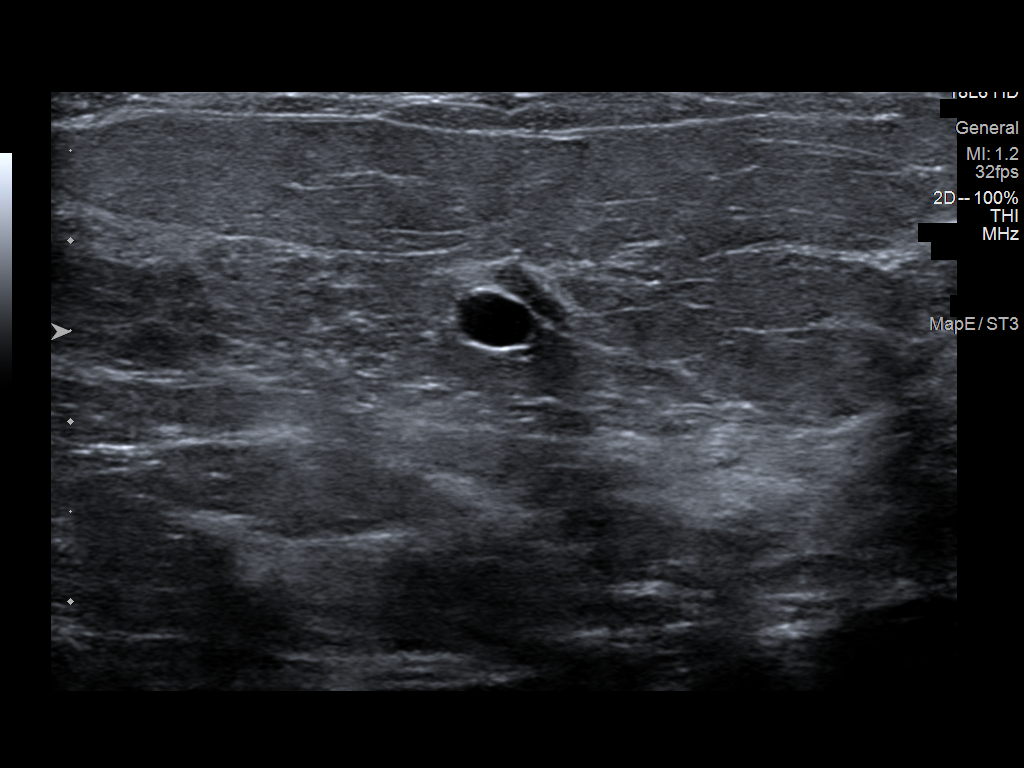
[im 6/7]
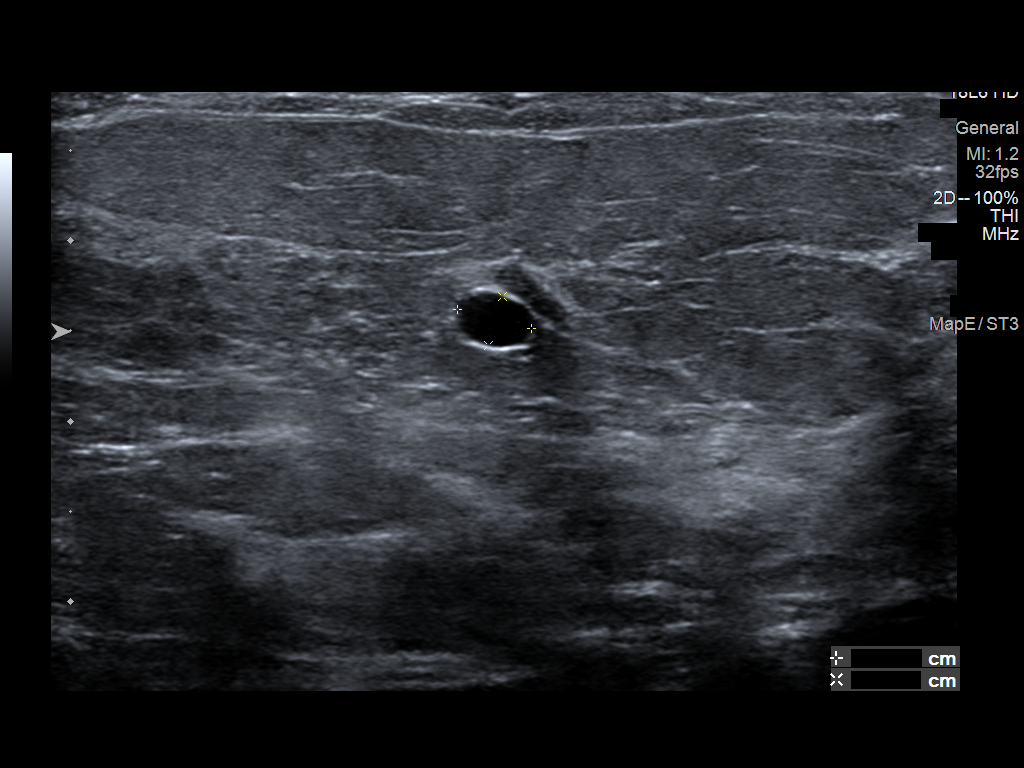
[im 7/7]
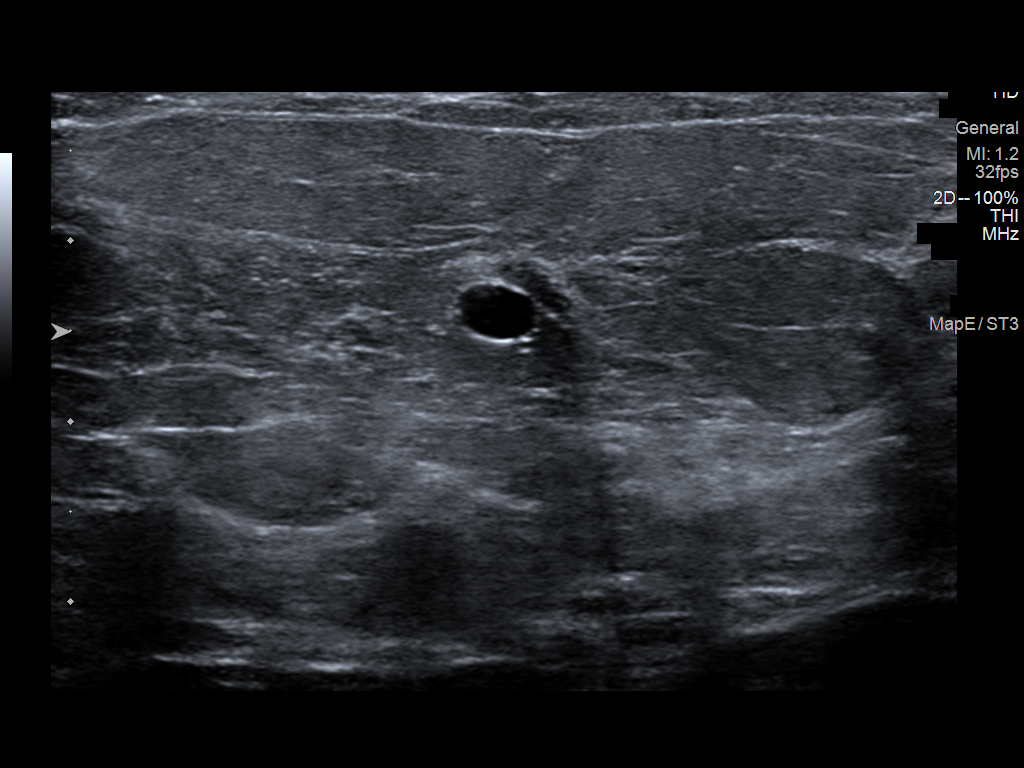

[7 of 7 positions shown; findings below may reference images not displayed]

ACR Breast Density Category b: There are scattered areas of
fibroglandular density.
FINDINGS: Additional 2-D and 3-D images are performed. These views confirm
presence of an oval circumscribed mass in the UPPER-OUTER QUADRANT
of the RIGHT breast.

Targeted ultrasound is performed, showing a simple cyst in the 11
o'clock location of the RIGHT breast 6 centimeters from the nipple
which measures 0.5 x 0.4 x 0.3 centimeters. No solid mass or areas
of acoustic shadowing.
IMPRESSION: Benign cyst in the RIGHT breast.

No mammographic or ultrasound evidence for malignancy.

RECOMMENDATION:
Screening mammogram in one year.(Code:NI-V-LI1)

I have discussed the findings and recommendations with the patient.
If applicable, a reminder letter will be sent to the patient
regarding the next appointment.

BI-RADS CATEGORY  2: Benign.

## 2022-07-23 ENCOUNTER — Other Ambulatory Visit: Payer: Self-pay | Admitting: Family Medicine

## 2022-07-23 DIAGNOSIS — Z1231 Encounter for screening mammogram for malignant neoplasm of breast: Secondary | ICD-10-CM

## 2022-07-31 ENCOUNTER — Ambulatory Visit: Payer: Self-pay

## 2022-08-05 ENCOUNTER — Ambulatory Visit: Payer: Self-pay | Admitting: Family Medicine

## 2022-08-14 ENCOUNTER — Ambulatory Visit (INDEPENDENT_AMBULATORY_CARE_PROVIDER_SITE_OTHER): Payer: Medicare HMO | Admitting: Family Medicine

## 2022-08-14 ENCOUNTER — Encounter: Payer: Self-pay | Admitting: Family Medicine

## 2022-08-14 VITALS — BP 136/76 | HR 129 | Ht 63.0 in | Wt 268.0 lb

## 2022-08-14 DIAGNOSIS — E559 Vitamin D deficiency, unspecified: Secondary | ICD-10-CM | POA: Diagnosis not present

## 2022-08-14 DIAGNOSIS — Z114 Encounter for screening for human immunodeficiency virus [HIV]: Secondary | ICD-10-CM

## 2022-08-14 DIAGNOSIS — Z2821 Immunization not carried out because of patient refusal: Secondary | ICD-10-CM | POA: Diagnosis not present

## 2022-08-14 DIAGNOSIS — E7849 Other hyperlipidemia: Secondary | ICD-10-CM

## 2022-08-14 DIAGNOSIS — R7301 Impaired fasting glucose: Secondary | ICD-10-CM | POA: Diagnosis not present

## 2022-08-14 DIAGNOSIS — Z1211 Encounter for screening for malignant neoplasm of colon: Secondary | ICD-10-CM | POA: Diagnosis not present

## 2022-08-14 DIAGNOSIS — Z1159 Encounter for screening for other viral diseases: Secondary | ICD-10-CM | POA: Diagnosis not present

## 2022-08-14 DIAGNOSIS — E038 Other specified hypothyroidism: Secondary | ICD-10-CM | POA: Diagnosis not present

## 2022-08-14 DIAGNOSIS — R69 Illness, unspecified: Secondary | ICD-10-CM | POA: Diagnosis not present
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
## 2022-08-14 NOTE — Assessment & Plan Note (Addendum)
She takes 112 mcg daily of Synthroid We will get basic labs today with one-time HIV and hep C screening and assess her thyroid levels She reports following up with her endocrinologist

## 2022-08-14 NOTE — Patient Instructions (Addendum)
I appreciate the opportunity to provide care to you today!    Follow up:  3 months  Labs: please stop by the lab during the week to get your blood drawn (CBC, CMP, TSH, Lipid profile, HgA1c, Vit D)  Screening: HIV and Hep C  Please continue to a heart-healthy diet and increase your physical activities. Try to exercise for 103mins at least three times a week.      It was a pleasure to see you and I look forward to continuing to work together on your health and well-being. Please do not hesitate to call the office if you need care or have questions about your care.   Have a wonderful day and week. With Gratitude, Alvira Monday MSN, FNP-BC

## 2022-08-15 ENCOUNTER — Ambulatory Visit
Admission: RE | Admit: 2022-08-15 | Discharge: 2022-08-15 | Disposition: A | Discharge status: Home / Self Care | Payer: Medicare HMO | Source: Ambulatory Visit | Attending: Family Medicine | Admitting: Family Medicine

## 2022-08-15 DIAGNOSIS — Z1211 Encounter for screening for malignant neoplasm of colon: Secondary | ICD-10-CM | POA: Diagnosis not present

## 2022-08-15 DIAGNOSIS — R69 Illness, unspecified: Secondary | ICD-10-CM | POA: Diagnosis not present

## 2022-08-15 DIAGNOSIS — Z1159 Encounter for screening for other viral diseases: Secondary | ICD-10-CM | POA: Diagnosis not present

## 2022-08-15 DIAGNOSIS — R7301 Impaired fasting glucose: Secondary | ICD-10-CM | POA: Diagnosis not present

## 2022-08-15 DIAGNOSIS — E7849 Other hyperlipidemia: Secondary | ICD-10-CM | POA: Diagnosis not present

## 2022-08-15 DIAGNOSIS — E038 Other specified hypothyroidism: Secondary | ICD-10-CM | POA: Diagnosis not present

## 2022-08-15 DIAGNOSIS — Z1231 Encounter for screening mammogram for malignant neoplasm of breast: Secondary | ICD-10-CM

## 2022-08-15 DIAGNOSIS — E559 Vitamin D deficiency, unspecified: Secondary | ICD-10-CM | POA: Diagnosis not present

## 2022-08-15 DIAGNOSIS — Z114 Encounter for screening for human immunodeficiency virus [HIV]: Secondary | ICD-10-CM | POA: Diagnosis not present

## 2022-08-17 LAB — HEMOGLOBIN A1C
Est. average glucose Bld gHb Est-mCnc: 97 mg/dL
Hgb A1c MFr Bld: 5 % (ref 4.8–5.6)

## 2022-08-17 LAB — LIPID PANEL
Chol/HDL Ratio: 6.4 ratio — ABNORMAL HIGH (ref 0.0–4.4)
Cholesterol, Total: 231 mg/dL — ABNORMAL HIGH (ref 100–199)
HDL: 36 mg/dL — ABNORMAL LOW
LDL Chol Calc (NIH): 170 mg/dL — ABNORMAL HIGH (ref 0–99)
Triglycerides: 135 mg/dL (ref 0–149)
VLDL Cholesterol Cal: 25 mg/dL (ref 5–40)

## 2022-08-17 LAB — CBC WITH DIFFERENTIAL/PLATELET
Basophils Absolute: 0.1 10*3/uL (ref 0.0–0.2)
Basos: 1 %
EOS (ABSOLUTE): 0 10*3/uL (ref 0.0–0.4)
Eos: 1 %
Hematocrit: 42.7 % (ref 34.0–46.6)
Hemoglobin: 13.8 g/dL (ref 11.1–15.9)
Immature Grans (Abs): 0 10*3/uL (ref 0.0–0.1)
Immature Granulocytes: 0 %
Lymphocytes Absolute: 1.6 10*3/uL (ref 0.7–3.1)
Lymphs: 19 %
MCH: 29.1 pg (ref 26.6–33.0)
MCHC: 32.3 g/dL (ref 31.5–35.7)
MCV: 90 fL (ref 79–97)
Monocytes Absolute: 0.4 10*3/uL (ref 0.1–0.9)
Monocytes: 5 %
Neutrophils Absolute: 6.6 10*3/uL (ref 1.4–7.0)
Neutrophils: 74 %
Platelets: 383 10*3/uL (ref 150–450)
RBC: 4.75 x10E6/uL (ref 3.77–5.28)
RDW: 12.1 % (ref 11.7–15.4)
WBC: 8.8 10*3/uL (ref 3.4–10.8)

## 2022-08-17 LAB — FECAL OCCULT BLOOD, IMMUNOCHEMICAL: Fecal Occult Bld: NEGATIVE

## 2022-08-17 LAB — TSH+FREE T4
Free T4: 2.22 ng/dL — ABNORMAL HIGH (ref 0.82–1.77)
TSH: 0.744 u[IU]/mL (ref 0.450–4.500)

## 2022-08-17 LAB — CMP14+EGFR
ALT: 22 IU/L (ref 0–32)
AST: 24 IU/L (ref 0–40)
Albumin/Globulin Ratio: 2 (ref 1.2–2.2)
Albumin: 4.5 g/dL (ref 3.8–4.9)
Alkaline Phosphatase: 103 IU/L (ref 44–121)
BUN/Creatinine Ratio: 15 (ref 9–23)
BUN: 14 mg/dL (ref 6–24)
Bilirubin Total: 0.7 mg/dL (ref 0.0–1.2)
CO2: 21 mmol/L (ref 20–29)
Calcium: 9.9 mg/dL (ref 8.7–10.2)
Chloride: 103 mmol/L (ref 96–106)
Creatinine, Ser: 0.95 mg/dL (ref 0.57–1.00)
Globulin, Total: 2.3 g/dL (ref 1.5–4.5)
Glucose: 89 mg/dL (ref 70–99)
Potassium: 4.3 mmol/L (ref 3.5–5.2)
Sodium: 142 mmol/L (ref 134–144)
Total Protein: 6.8 g/dL (ref 6.0–8.5)
eGFR: 72 mL/min/{1.73_m2} (ref >59)

## 2022-08-17 LAB — HIV ANTIBODY (ROUTINE TESTING W REFLEX): HIV Screen 4th Generation wRfx: NONREACTIVE

## 2022-08-17 LAB — HEPATITIS C ANTIBODY: Hep C Virus Ab: NONREACTIVE

## 2022-08-17 LAB — VITAMIN D 25 HYDROXY (VIT D DEFICIENCY, FRACTURES): Vit D, 25-Hydroxy: 38.8 ng/mL (ref 30.0–100.0)

## 2022-08-19 ENCOUNTER — Other Ambulatory Visit: Payer: Self-pay | Admitting: Family Medicine

## 2022-08-19 DIAGNOSIS — E038 Other specified hypothyroidism: Secondary | ICD-10-CM

## 2022-08-19 NOTE — Progress Notes (Signed)
  Please encouraged the patient to return for labs on September 30, 2022.  Her thyroid levels were elevate, and I  would like to reassess it in 6 weeks.  Her cholesterol levels are elevated; I recommend a low carbs and fat diet with increased physical activities.  All other labs are stable

## 2022-08-21 ENCOUNTER — Telehealth: Payer: Self-pay | Admitting: Family Medicine

## 2022-08-21 NOTE — Telephone Encounter (Signed)
Pt called in regards to elevated tsh results viewed in MyChart. Can you please contact her? She is wanting to know about increasing her medication ?

## 2022-08-24 NOTE — Telephone Encounter (Signed)
Please inform the patient that her TSH was low normal  and her T4 was elevated.  No changes to treatment regimen at the moment. please advise the patient to return for labs on 09/30/22  to reassess her thyroid levels.  Please encourage her to continue her current treatment regimen.

## 2022-08-27 NOTE — Telephone Encounter (Signed)
Pt informed

## 2022-10-02 ENCOUNTER — Other Ambulatory Visit: Payer: Self-pay | Admitting: Family Medicine

## 2022-10-02 DIAGNOSIS — E038 Other specified hypothyroidism: Secondary | ICD-10-CM | POA: Diagnosis not present

## 2022-10-03 LAB — TSH: TSH: 1.89 u[IU]/mL (ref 0.450–4.500)

## 2022-10-03 NOTE — Progress Notes (Signed)
Please inform the patient that her thyroid levels are stable

## 2022-10-23 DIAGNOSIS — H52223 Regular astigmatism, bilateral: Secondary | ICD-10-CM | POA: Diagnosis not present

## 2022-10-23 DIAGNOSIS — H524 Presbyopia: Secondary | ICD-10-CM | POA: Diagnosis not present

## 2022-10-23 DIAGNOSIS — H5213 Myopia, bilateral: Secondary | ICD-10-CM | POA: Diagnosis not present

## 2022-11-04 DIAGNOSIS — H5213 Myopia, bilateral: Secondary | ICD-10-CM | POA: Diagnosis not present

## 2022-11-04 DIAGNOSIS — H52223 Regular astigmatism, bilateral: Secondary | ICD-10-CM | POA: Diagnosis not present

## 2022-11-08 DIAGNOSIS — M5412 Radiculopathy, cervical region: Secondary | ICD-10-CM | POA: Diagnosis not present

## 2022-11-08 DIAGNOSIS — M5417 Radiculopathy, lumbosacral region: Secondary | ICD-10-CM | POA: Diagnosis not present

## 2022-11-08 DIAGNOSIS — G603 Idiopathic progressive neuropathy: Secondary | ICD-10-CM | POA: Diagnosis not present

## 2022-11-15 ENCOUNTER — Ambulatory Visit: Payer: Medicare HMO | Admitting: Family Medicine

## 2022-11-21 ENCOUNTER — Ambulatory Visit (INDEPENDENT_AMBULATORY_CARE_PROVIDER_SITE_OTHER): Payer: Medicare HMO | Admitting: Family Medicine

## 2022-11-21 ENCOUNTER — Encounter: Payer: Self-pay | Admitting: Family Medicine

## 2022-11-21 VITALS — BP 134/88 | HR 96 | Ht 64.0 in | Wt 267.1 lb

## 2022-11-21 DIAGNOSIS — E7849 Other hyperlipidemia: Secondary | ICD-10-CM | POA: Diagnosis not present

## 2022-11-21 DIAGNOSIS — E559 Vitamin D deficiency, unspecified: Secondary | ICD-10-CM

## 2022-11-21 DIAGNOSIS — E038 Other specified hypothyroidism: Secondary | ICD-10-CM | POA: Diagnosis not present

## 2022-11-21 DIAGNOSIS — R7301 Impaired fasting glucose: Secondary | ICD-10-CM

## 2022-11-21 NOTE — Progress Notes (Signed)
Established Patient Office Visit  Subjective:  Patient ID: Linda Rollins, female    DOB: Oct 06, 1971  Age: 52 y.o. MRN: [id number]  CC:  Chief Complaint  Patient presents with   Follow-up    3 month f/u.     HPI Linda Rollins is a 52 y.o. female with past medical history of hypothyroidism presents for f/u of  chronic medical conditions. For the details of today's visit, please refer to the assessment and plan.     Past Medical History:  Diagnosis Date   Dizziness    Thyroid disease    Vertigo     Past Surgical History:  Procedure Laterality Date   cortisone shots      [facility] @ [facility]; Dr. Doran Durand (feet) and Dr. Veverly Fells (knees)   DENTAL SURGERY     wisdom teeth removal   LEEP      Family History  Problem Relation Age of Onset   Pulmonary embolism Mother    High blood pressure Other        father's side   Diabetes Other        mother's side   Heart disease Other        mother's side    Social History   Socioeconomic History   Marital status: Single    Spouse name: Not on file   Number of children: 1   Years of education: Not on file   Highest education level: Some college, no degree  Occupational History   Not on file  Tobacco Use   Smoking status: Never   Smokeless tobacco: Never  Vaping Use   Vaping Use: Never used  Substance and Sexual Activity   Alcohol use: Never   Drug use: Never   Sexual activity: Not on file  Other Topics Concern   Not on file  Social History Narrative   Lives at home with her son   Right handed   Caffeine: no   Social Determinants of Health   Financial Resource Strain: Not on file  Food Insecurity: Not on file  Transportation Needs: Not on file  Physical Activity: Not on file  Stress: Not on file  Social Connections: Not on file  Intimate Partner Violence: Not on file    Outpatient Medications Prior to Visit  Medication Sig Dispense Refill   ibuprofen (ADVIL,MOTRIN) 200 MG tablet Take 600 mg by  mouth daily as needed for moderate pain.      levothyroxine (SYNTHROID) 112 MCG tablet Take 112 mcg by mouth daily.     No facility-administered medications prior to visit.    No Known Allergies  ROS Review of Systems  Constitutional:  Negative for chills and fever.  Eyes:  Negative for visual disturbance.  Respiratory:  Negative for chest tightness and shortness of breath.   Neurological:  Negative for dizziness and headaches.      Objective:    Physical Exam HENT:     Head: Normocephalic.     Mouth/Throat:     Mouth: Mucous membranes are moist.  Cardiovascular:     Rate and Rhythm: Normal rate.     Heart sounds: Normal heart sounds.  Pulmonary:     Effort: Pulmonary effort is normal.     Breath sounds: Normal breath sounds.  Neurological:     Mental Status: She is alert.     BP 134/88 (BP Location: Left Arm)   Pulse 96   Ht 5\' 4"  (1.626 m)   Wt  267 lb 1.9 oz (121.2 kg)   LMP 06/29/2017   SpO2 92%   BMI 45.85 kg/m  Wt Readings from Last 3 Encounters:  11/21/22 267 lb 1.9 oz (121.2 kg)  08/14/22 268 lb 0.6 oz (121.6 kg)  05/12/19 255 lb (115.7 kg)    Lab Results  Component Value Date   TSH 1.290 11/21/2022   Lab Results  Component Value Date   WBC 9.4 11/21/2022   HGB 13.5 11/21/2022   HCT 41.0 11/21/2022   MCV 88 11/21/2022   PLT 365 11/21/2022   Lab Results  Component Value Date   NA 140 11/21/2022   K 4.6 11/21/2022   CO2 18 (L) 11/21/2022   GLUCOSE 93 11/21/2022   BUN 15 11/21/2022   CREATININE 0.78 11/21/2022   BILITOT 0.6 11/21/2022   ALKPHOS 104 11/21/2022   AST 22 11/21/2022   ALT 27 11/21/2022   PROT 6.3 11/21/2022   ALBUMIN 4.3 11/21/2022   CALCIUM 9.7 11/21/2022   ANIONGAP 9 03/04/2019   EGFR 91 11/21/2022   Lab Results  Component Value Date   CHOL 233 (H) 11/21/2022   Lab Results  Component Value Date   HDL 45 11/21/2022   Lab Results  Component Value Date   LDLCALC 171 (H) 11/21/2022   Lab Results  Component  Value Date   TRIG 96 11/21/2022   Lab Results  Component Value Date   CHOLHDL 5.2 (H) 11/21/2022   Lab Results  Component Value Date   HGBA1C 5.0 11/21/2022      Assessment & Plan:  Other specified hypothyroidism Assessment & Plan: She takes Synthroid 112 mcg daily Patient is compliant with treatment regimen Will assess thyroid levels today Lab Results  Component Value Date   TSH 1.290 11/21/2022     Orders: -     TSH + free T4  Other hyperlipidemia -     Lipid panel -     CMP14+EGFR -     CBC with Differential/Platelet  Vitamin D deficiency -     VITAMIN D 25 Hydroxy (Vit-D Deficiency, Fractures)  IFG (impaired fasting glucose) -     Hemoglobin A1c    Follow-up: Return in about 1 month (around 12/22/2022).   Alvira Monday, FNP

## 2022-11-21 NOTE — Patient Instructions (Addendum)
I appreciate the opportunity to provide care to you today!    Follow up:   1 month for pap smear  Labs: please stop by the lab today to get your blood drawn (CBC, CMP, TSH, Lipid profile, HgA1c, Vit D)  Please schedule Annual Wellness Exam   Please continue to a heart-healthy diet and increase your physical activities. Try to exercise for 31mins at least five times a week.   Physical activity helps: Lower your blood glucose, improve your heart health, lower your blood pressure and cholesterol, burn calories to help manage her weight, gave you energy, lower stress, and improve his sleep.  The American diabetes Association (ADA) recommends being active for 2-1/2 hours (150 minutes) or more week.  Exercise for 30 minutes, 5 days a week (150 minutes total)     It was a pleasure to see you and I look forward to continuing to work together on your health and well-being. Please do not hesitate to call the office if you need care or have questions about your care.   Have a wonderful day and week. With Gratitude, Alvira Monday MSN, FNP-BC

## 2022-11-22 LAB — CBC WITH DIFFERENTIAL/PLATELET
Basophils Absolute: 0.1 10*3/uL (ref 0.0–0.2)
Basos: 1 %
EOS (ABSOLUTE): 0.1 10*3/uL (ref 0.0–0.4)
Eos: 2 %
Hematocrit: 41 % (ref 34.0–46.6)
Hemoglobin: 13.5 g/dL (ref 11.1–15.9)
Immature Grans (Abs): 0.1 10*3/uL (ref 0.0–0.1)
Immature Granulocytes: 1 %
Lymphocytes Absolute: 2.7 10*3/uL (ref 0.7–3.1)
Lymphs: 28 %
MCH: 28.9 pg (ref 26.6–33.0)
MCHC: 32.9 g/dL (ref 31.5–35.7)
MCV: 88 fL (ref 79–97)
Monocytes Absolute: 0.5 10*3/uL (ref 0.1–0.9)
Monocytes: 5 %
Neutrophils Absolute: 6 10*3/uL (ref 1.4–7.0)
Neutrophils: 63 %
Platelets: 365 10*3/uL (ref 150–450)
RBC: 4.67 x10E6/uL (ref 3.77–5.28)
RDW: 11.9 % (ref 11.7–15.4)
WBC: 9.4 10*3/uL (ref 3.4–10.8)

## 2022-11-22 LAB — CMP14+EGFR
ALT: 27 IU/L (ref 0–32)
AST: 22 IU/L (ref 0–40)
Albumin/Globulin Ratio: 2.2 (ref 1.2–2.2)
Albumin: 4.3 g/dL (ref 3.8–4.9)
Alkaline Phosphatase: 104 IU/L (ref 44–121)
BUN/Creatinine Ratio: 19 (ref 9–23)
BUN: 15 mg/dL (ref 6–24)
Bilirubin Total: 0.6 mg/dL (ref 0.0–1.2)
CO2: 18 mmol/L — ABNORMAL LOW (ref 20–29)
Calcium: 9.7 mg/dL (ref 8.7–10.2)
Chloride: 104 mmol/L (ref 96–106)
Creatinine, Ser: 0.78 mg/dL (ref 0.57–1.00)
Globulin, Total: 2 g/dL (ref 1.5–4.5)
Glucose: 93 mg/dL (ref 70–99)
Potassium: 4.6 mmol/L (ref 3.5–5.2)
Sodium: 140 mmol/L (ref 134–144)
Total Protein: 6.3 g/dL (ref 6.0–8.5)
eGFR: 91 mL/min/{1.73_m2} (ref >59)

## 2022-11-22 LAB — TSH+FREE T4
Free T4: 1.77 ng/dL (ref 0.82–1.77)
TSH: 1.29 u[IU]/mL (ref 0.450–4.500)

## 2022-11-22 LAB — HEMOGLOBIN A1C
Est. average glucose Bld gHb Est-mCnc: 97 mg/dL
Hgb A1c MFr Bld: 5 % (ref 4.8–5.6)

## 2022-11-22 LAB — LIPID PANEL
Chol/HDL Ratio: 5.2 ratio — ABNORMAL HIGH (ref 0.0–4.4)
Cholesterol, Total: 233 mg/dL — ABNORMAL HIGH (ref 100–199)
HDL: 45 mg/dL (ref >39)
LDL Chol Calc (NIH): 171 mg/dL — ABNORMAL HIGH (ref 0–99)
Triglycerides: 96 mg/dL (ref 0–149)
VLDL Cholesterol Cal: 17 mg/dL (ref 5–40)

## 2022-11-22 LAB — VITAMIN D 25 HYDROXY (VIT D DEFICIENCY, FRACTURES): Vit D, 25-Hydroxy: 39.9 ng/mL (ref 30.0–100.0)

## 2022-11-22 NOTE — Assessment & Plan Note (Signed)
She takes Synthroid 112 mcg daily Patient is compliant with treatment regimen Will assess thyroid levels today Lab Results  Component Value Date   TSH 1.290 11/21/2022

## 2022-11-23 NOTE — Progress Notes (Signed)
Your cholesterol levels are elevated. I want your LDL to be less than 100 I recommend avoiding simple carbohydrates including cakes, sweet desserts, ice cream, soda (diet or regular), sweet tea, candies, chips, cookies, store-bought juices, alcohol in excess of 1-2 drinks a day, lemonade, artificial sweeteners, donuts, coffee creamers, and sugar-free products.  I recommend avoiding greasy, fatty foods with increased physical activity. All other labs are stable.

## 2022-12-06 ENCOUNTER — Telehealth: Payer: Self-pay | Admitting: Family Medicine

## 2022-12-06 ENCOUNTER — Other Ambulatory Visit: Payer: Self-pay | Admitting: Family Medicine

## 2022-12-06 MED ORDER — LEVOTHYROXINE SODIUM 112 MCG PO TABS: 112.0000 ug | ORAL_TABLET | Freq: Every day | ORAL | 2 refills | Status: DC

## 2022-12-06 NOTE — Telephone Encounter (Signed)
Prescription Request  12/06/2022  Is this a "Controlled Substance" medicine? No  LOV: 11/21/2022  What is the name of the medication or equipment? levothyroxine (SYNTHROID) 112 MCG tablet   Have you contacted your pharmacy to request a refill? Yes   Which pharmacy would you like this sent to?  [place], [place] [id number] [place] [id number] Phone: [phone] Fax: [phone]    Patient notified that their request is being sent to the clinical staff for review and that they should receive a response within 2 business days.   Please advise at [place]

## 2022-12-06 NOTE — Telephone Encounter (Signed)
Rx sent 

## 2022-12-24 ENCOUNTER — Ambulatory Visit: Payer: Medicare HMO | Admitting: Family Medicine

## 2023-01-08 ENCOUNTER — Ambulatory Visit (INDEPENDENT_AMBULATORY_CARE_PROVIDER_SITE_OTHER): Payer: Medicare HMO | Admitting: Family Medicine

## 2023-01-08 ENCOUNTER — Encounter: Payer: Self-pay | Admitting: Family Medicine

## 2023-01-08 VITALS — BP 138/82 | HR 107 | Ht 63.0 in | Wt 262.1 lb

## 2023-01-08 DIAGNOSIS — L918 Other hypertrophic disorders of the skin: Secondary | ICD-10-CM

## 2023-01-08 DIAGNOSIS — Z0000 Encounter for general adult medical examination without abnormal findings: Secondary | ICD-10-CM | POA: Diagnosis not present

## 2023-01-08 NOTE — Patient Instructions (Addendum)
.  I appreciate the opportunity to provide care to you today!    Follow up:  01/22/23   Referrals today- dermatology for removal of skin tags   Please continue to a heart-healthy diet and increase your physical activities. Try to exercise for 29mins at least five days a week.      It was a pleasure to see you and I look forward to continuing to work together on your health and well-being. Please do not hesitate to call the office if you need care or have questions about your care.   Have a wonderful day and week. With Gratitude, Alvira Monday MSN, FNP-BC

## 2023-01-08 NOTE — Progress Notes (Signed)
Subjective:    Linda Rollins is a 52 y.o. female who presents for a Welcome to Medicare exam.   Review of Systems         Objective:    There were no vitals filed for this visit.There is no height or weight on file to calculate BMI.  Medications Outpatient Encounter Medications as of 01/08/2023  Medication Sig   ibuprofen (ADVIL,MOTRIN) 200 MG tablet Take 600 mg by mouth daily as needed for moderate pain.    levothyroxine (SYNTHROID) 112 MCG tablet Take 1 tablet (112 mcg total) by mouth daily.   No facility-administered encounter medications on file as of 01/08/2023.     History: Past Medical History:  Diagnosis Date   Dizziness    Thyroid disease    Vertigo    Past Surgical History:  Procedure Laterality Date   cortisone shots      [facility] @ [facility]; Dr. Doran Durand (feet) and Dr. Veverly Fells (knees)   DENTAL SURGERY     wisdom teeth removal   LEEP      Family History  Problem Relation Age of Onset   Pulmonary embolism Mother    High blood pressure Other        father's side   Diabetes Other        mother's side   Heart disease Other        mother's side   Social History   Occupational History   Not on file  Tobacco Use   Smoking status: Never   Smokeless tobacco: Never  Vaping Use   Vaping Use: Never used  Substance and Sexual Activity   Alcohol use: Never   Drug use: Never   Sexual activity: Not on file    Tobacco Counseling Counseling given: Not Answered   Immunizations and Health Maintenance  Please go to your local pharmacy for TDAP AND [place]  There is no immunization history on file for this patient. Health Maintenance Due  Topic Date Due   COVID-19 Vaccine (1) Never done   DTaP/Tdap/Td (1 - Tdap) Never done   PAP SMEAR-Modifier  Never done   Zoster Vaccines- Shingrix (1 of 2) Never done    Activities of Daily Living     No data to display           Physical Exam  (optional), or other factors deemed  appropriate based on the beneficiary's medical and social history and current clinical standards.  Advanced Directives:      Assessment:    This is a routine wellness examination for this patient .   Vision/Hearing screen No results found.  Dietary issues and exercise activities discussed:      Goals   None   Depression Screen    11/21/2022    9:31 AM 08/14/2022    1:59 PM  PHQ 2/9 Scores  PHQ - 2 Score 2 1  PHQ- 9 Score 8      Fall Risk    11/21/2022    9:30 AM  Fall Risk   Falls in the past year? 0  Number falls in past yr: 1  Injury with Fall? 1  Risk for fall due to : No Fall Risks  Follow up Falls evaluation completed    Cognitive Function:        Patient Care Team: Alvira Monday, FNP as PCP - General (Family Medicine)     Plan:     I have personally reviewed and noted the following  in the patient's chart:   Medical and social history Use of alcohol, tobacco or illicit drugs  Current medications and supplements Functional ability and status Nutritional status Physical activity Advanced directives List of other physicians Hospitalizations, surgeries, and ER visits in previous 12 months Vitals Screenings to include cognitive, depression, and falls Referrals and appointments  In addition, I have reviewed and discussed with patient certain preventive protocols, quality metrics, and best practice recommendations. A written personalized care plan for preventive services as well as general preventive health recommendations were provided to patient.     Alvira Monday, NP 01/08/2023

## 2023-01-13 ENCOUNTER — Telehealth: Payer: Self-pay | Admitting: Family Medicine

## 2023-01-13 NOTE — Telephone Encounter (Signed)
Patient called said [facility] dermatologist needs patient insurance information would not take the information from the patient. Per patient our referral department needs to contact [facility] Dermatologist.

## 2023-01-22 ENCOUNTER — Ambulatory Visit: Payer: Medicare HMO | Admitting: Family Medicine

## 2023-01-29 ENCOUNTER — Ambulatory Visit: Payer: Medicare HMO | Admitting: Family Medicine

## 2023-01-29 ENCOUNTER — Encounter: Payer: Self-pay | Admitting: Family Medicine

## 2023-02-10 ENCOUNTER — Encounter: Payer: Self-pay | Admitting: Family Medicine

## 2023-02-10 ENCOUNTER — Ambulatory Visit (INDEPENDENT_AMBULATORY_CARE_PROVIDER_SITE_OTHER): Payer: Medicare HMO | Admitting: Family Medicine

## 2023-02-10 ENCOUNTER — Other Ambulatory Visit (HOSPITAL_COMMUNITY)
Admission: RE | Admit: 2023-02-10 | Discharge: 2023-02-10 | Disposition: A | Discharge status: Home / Self Care | Payer: Medicare HMO | Source: Ambulatory Visit | Attending: Family Medicine | Admitting: Family Medicine

## 2023-02-10 VITALS — BP 116/68 | HR 113 | Ht 63.0 in | Wt 260.0 lb

## 2023-02-10 DIAGNOSIS — Z01419 Encounter for gynecological examination (general) (routine) without abnormal findings: Secondary | ICD-10-CM | POA: Diagnosis present

## 2023-02-10 DIAGNOSIS — Z124 Encounter for screening for malignant neoplasm of cervix: Secondary | ICD-10-CM | POA: Insufficient documentation

## 2023-02-10 DIAGNOSIS — Z1151 Encounter for screening for human papillomavirus (HPV): Secondary | ICD-10-CM | POA: Insufficient documentation

## 2023-02-10 DIAGNOSIS — R69 Illness, unspecified: Secondary | ICD-10-CM | POA: Diagnosis not present

## 2023-02-10 NOTE — Patient Instructions (Addendum)
I appreciate the opportunity to provide care to you today!    Follow up:  3 months  We will let you know the results of your Pap examination on MyChart     Please continue to a heart-healthy diet and increase your physical activities. Try to exercise for at least five days a week.      It was a pleasure to see you and I look forward to continuing to work together on your health and well-being. Please do not hesitate to call the office if you need care or have questions about your care.   Have a wonderful day and week. With Gratitude, Gilmore Laroche MSN, FNP-BC

## 2023-02-10 NOTE — Assessment & Plan Note (Signed)
Cytology and HPV co-testing (preferred) every 5 years or cytology alone (acceptable) every 3 years. - Pap due 2029   

## 2023-02-10 NOTE — Progress Notes (Signed)
Established Patient Office Visit  Subjective:  Patient ID: Linda Rollins, female    DOB: 02/08/1971  Age: 52 y.o. MRN: [id number]  CC:  Chief Complaint  Patient presents with   Gynecologic Exam    Pap & breast exam.     HPI Linda Rollins is a 52 y.o. female  presents for a Pap examination today. For the details of today's visit, please refer to the assessment and plan.     Past Medical History:  Diagnosis Date   Dizziness    Thyroid disease    Vertigo     Past Surgical History:  Procedure Laterality Date   cortisone shots      [facility] @ [facility]; Dr. Victorino Dike (feet) and Dr. Ranell Patrick (knees)   DENTAL SURGERY     wisdom teeth removal   LEEP      Family History  Problem Relation Age of Onset   Pulmonary embolism Mother    High blood pressure Other        father's side   Diabetes Other        mother's side   Heart disease Other        mother's side    Social History   Socioeconomic History   Marital status: Single    Spouse name: Not on file   Number of children: 1   Years of education: Not on file   Highest education level: 12th grade  Occupational History   Not on file  Tobacco Use   Smoking status: Never   Smokeless tobacco: Never  Vaping Use   Vaping Use: Never used  Substance and Sexual Activity   Alcohol use: Never   Drug use: Never   Sexual activity: Not on file  Other Topics Concern   Not on file  Social History Narrative   Lives at home with her son   Right handed   Caffeine: no   Social Determinants of Health   Financial Resource Strain: Medium Risk (02/06/2023)   Overall Financial Resource Strain (CARDIA)    Difficulty of Paying Living Expenses: Somewhat hard  Food Insecurity: Food Insecurity Present (02/06/2023)   Hunger Vital Sign    Worried About Running Out of Food in the Last Year: Sometimes true    Ran Out of Food in the Last Year: Sometimes true  Transportation Needs: Unmet Transportation Needs (02/06/2023)    PRAPARE - Administrator, Civil Service (Medical): Yes    Lack of Transportation (Non-Medical): No  Physical Activity: Inactive (02/06/2023)   Exercise Vital Sign    Days of Exercise per Week: 0 days    Minutes of Exercise per Session: 30 min  Stress: Stress Concern Present (02/06/2023)   [employer] of Occupational Health - Occupational Stress Questionnaire    Feeling of Stress : Rather much  Social Connections: Socially Isolated (02/06/2023)   Social Connection and Isolation Panel [NHANES]    Frequency of Communication with Friends and Family: Twice a week    Frequency of Social Gatherings with Friends and Family: Never    Attends Religious Services: Never    Database administrator or Organizations: No    Attends Banker Meetings: Never    Marital Status: Never married  Intimate Partner Violence: Not At Risk (01/08/2023)   Humiliation, Afraid, Rape, and Kick questionnaire    Fear of Current or Ex-Partner: No    Emotionally Abused: No    Physically Abused: No  Sexually Abused: No    Outpatient Medications Prior to Visit  Medication Sig Dispense Refill   ibuprofen (ADVIL,MOTRIN) 200 MG tablet Take 600 mg by mouth daily as needed for moderate pain.      levothyroxine (SYNTHROID) 112 MCG tablet Take 1 tablet (112 mcg total) by mouth daily. 30 tablet 2   No facility-administered medications prior to visit.    No Known Allergies  ROS Review of Systems  Constitutional:  Negative for chills and fever.  Eyes:  Negative for visual disturbance.  Respiratory:  Negative for chest tightness and shortness of breath.   Genitourinary:  Negative for decreased urine volume, menstrual problem, pelvic pain, vaginal bleeding and vaginal discharge.  Neurological:  Negative for dizziness and headaches.      Objective:    Physical Exam HENT:     Head: Normocephalic.     Mouth/Throat:     Mouth: Mucous membranes are moist.  Cardiovascular:     Rate and  Rhythm: Normal rate.     Heart sounds: Normal heart sounds.  Pulmonary:     Effort: Pulmonary effort is normal.     Breath sounds: Normal breath sounds.  Chest:     Chest wall: No mass or lacerations.  Breasts:    Tanner Score is 5.     Right: No swelling, bleeding, inverted nipple, mass, nipple discharge, skin change or tenderness.     Left: No swelling, bleeding, inverted nipple, mass, nipple discharge, skin change or tenderness.  Genitourinary:    Exam position: Lithotomy position.     Pubic Area: No rash or pubic lice.      Tanner stage (genital): 5.     Labia:        Right: No rash, lesion or injury.        Left: No rash, tenderness, lesion or injury.      Comments: Vaginal wall: pink and rugated, smooth and non-tender; absence of lesions, edema, and erythema. Labia Majora and Minora: present bilaterally, moist, soft tissue, and homogeneous; free of edema and ulcerations. Clitoris is anatomically present, above the urethral, and free of lesions, masses, and ulceration.    Neurological:     Mental Status: She is alert.     BP 116/68   Pulse (!) 113   Ht  (1.6 m)   Wt 260 lb 0.6 oz (118 kg)   LMP 06/29/2017   SpO2 92%   BMI 46.06 kg/m  Wt Readings from Last 3 Encounters:  02/10/23 260 lb 0.6 oz (118 kg)  01/08/23 262 lb 1.9 oz (118.9 kg)  11/21/22 267 lb 1.9 oz (121.2 kg)    Lab Results  Component Value Date   TSH 1.290 11/21/2022   Lab Results  Component Value Date   WBC 9.4 11/21/2022   HGB 13.5 11/21/2022   HCT 41.0 11/21/2022   MCV 88 11/21/2022   PLT 365 11/21/2022   Lab Results  Component Value Date   NA 140 11/21/2022   K 4.6 11/21/2022   CO2 18 (L) 11/21/2022   GLUCOSE 93 11/21/2022   BUN 15 11/21/2022   CREATININE 0.78 11/21/2022   BILITOT 0.6 11/21/2022   ALKPHOS 104 11/21/2022   AST 22 11/21/2022   ALT 27 11/21/2022   PROT 6.3 11/21/2022   ALBUMIN 4.3 11/21/2022   CALCIUM 9.7 11/21/2022   ANIONGAP 9 03/04/2019   EGFR 91 11/21/2022    Lab Results  Component Value Date   CHOL 233 (H) 11/21/2022   Lab Results  Component Value Date   HDL 45 11/21/2022   Lab Results  Component Value Date   LDLCALC 171 (H) 11/21/2022   Lab Results  Component Value Date   TRIG 96 11/21/2022   Lab Results  Component Value Date   CHOLHDL 5.2 (H) 11/21/2022   Lab Results  Component Value Date   HGBA1C 5.0 11/21/2022      Assessment & Plan:  Cervical cancer screening Assessment & Plan: -Cytology and HPV co-testing (preferred) every 5 years or cytology alone (acceptable) every 3 years. - Pap due 2029    Orders: -     Cytology - PAP    Follow-up: Return in about 3 months (around 05/12/2023).   Gilmore Laroche, FNP

## 2023-02-11 DIAGNOSIS — G608 Other hereditary and idiopathic neuropathies: Secondary | ICD-10-CM | POA: Diagnosis not present

## 2023-02-11 DIAGNOSIS — M5412 Radiculopathy, cervical region: Secondary | ICD-10-CM | POA: Diagnosis not present

## 2023-02-11 DIAGNOSIS — E559 Vitamin D deficiency, unspecified: Secondary | ICD-10-CM | POA: Diagnosis not present

## 2023-02-11 DIAGNOSIS — R202 Paresthesia of skin: Secondary | ICD-10-CM | POA: Diagnosis not present

## 2023-02-11 DIAGNOSIS — M542 Cervicalgia: Secondary | ICD-10-CM | POA: Diagnosis not present

## 2023-02-11 DIAGNOSIS — M5417 Radiculopathy, lumbosacral region: Secondary | ICD-10-CM | POA: Diagnosis not present

## 2023-02-11 DIAGNOSIS — M5432 Sciatica, left side: Secondary | ICD-10-CM | POA: Diagnosis not present

## 2023-02-13 LAB — CYTOLOGY - PAP
Adequacy: ABSENT
Chlamydia: NEGATIVE
Comment: NEGATIVE
Comment: NEGATIVE
Comment: NORMAL
Diagnosis: NEGATIVE
Neisseria Gonorrhea: NEGATIVE
Trichomonas: NEGATIVE

## 2023-02-13 NOTE — Progress Notes (Signed)
Please inform the patient that her pap smear was negative for abnormal growth or malignancy on her cervix.

## 2023-02-25 DIAGNOSIS — M5417 Radiculopathy, lumbosacral region: Secondary | ICD-10-CM | POA: Diagnosis not present

## 2023-02-25 DIAGNOSIS — G603 Idiopathic progressive neuropathy: Secondary | ICD-10-CM | POA: Diagnosis not present

## 2023-02-25 DIAGNOSIS — M5412 Radiculopathy, cervical region: Secondary | ICD-10-CM | POA: Diagnosis not present

## 2023-02-27 ENCOUNTER — Other Ambulatory Visit: Payer: Self-pay | Admitting: Family Medicine

## 2023-03-03 ENCOUNTER — Telehealth: Payer: Self-pay | Admitting: Family Medicine

## 2023-03-03 NOTE — Telephone Encounter (Signed)
Can you please schedule pt for an in office appt? Thank you :)

## 2023-03-03 NOTE — Telephone Encounter (Signed)
Kindly ask the patient to schedule an appt

## 2023-03-03 NOTE — Telephone Encounter (Signed)
Pt would like to ask for a medication for weight loss states she will schedule an appt but just want to ask if you would consider this before she schedules and isnt able to start anything.

## 2023-03-03 NOTE — Telephone Encounter (Signed)
Pt is concerned about weight gain and would like recommendations.

## 2023-03-04 NOTE — Telephone Encounter (Signed)
Called pt and scheduled appt

## 2023-03-05 ENCOUNTER — Ambulatory Visit (INDEPENDENT_AMBULATORY_CARE_PROVIDER_SITE_OTHER): Payer: Medicare HMO | Admitting: Family Medicine

## 2023-03-05 ENCOUNTER — Encounter: Payer: Self-pay | Admitting: Family Medicine

## 2023-03-05 DIAGNOSIS — Z6841 Body Mass Index (BMI) 40.0 and over, adult: Secondary | ICD-10-CM

## 2023-03-05 NOTE — Patient Instructions (Addendum)
I appreciate the opportunity to provide care to you today!    Follow up:  05/12/2023  Here are some tips to lose weight  Eat three meals per day at times Cut out all diet bevergages and drink only water Eat whole food plant based meals Cut out junk food, fast food and processed foods Exercise 150 minutes a week Lose 1-2 lbs per week. Keep a food journal Choose foods that grow in a garden or in a fruit orchard and protein of animals with fins or feathers.  Lifestyle Medicine - Whole Food, Plant Predominant Nutrition is highly recommended: Eat Plenty of vegetables, Mushrooms, fruits, Legumes, Whole Grains, Nuts, seeds in lieu of processed meats, processed snacks/pastries red meat, poultry, eggs.   -It is better to avoid simple carbohydrates including: Cakes, Sweet Desserts, Ice Cream, Soda (diet and regular), Sweet Tea, Candies, Chips, Cookies, Store Bought Juices, Alcohol in Excess of  1-2 drinks a day, Lemonade,  Artificial Sweeteners, Doughnuts, Coffee Creamers, "Sugar-free" Products, etc, etc.  This is not a complete list..... Exercise: If you are able: 30 -60 minutes a day ,4 days a week, or 150 minutes a week.  The longer the better.  Combine stretch, strength, and aerobic activities.  If you were told in the past that you have high risk for cardiovascular diseases, you may seek evaluation by your heart doctor prior to initiating moderate to intense exercise programs.      Please continue to a heart-healthy diet and increase your physical activities. Try to exercise for at least five days a week.      It was a pleasure to see you and I look forward to continuing to work together on your health and well-being. Please do not hesitate to call the office if you need care or have questions about your care.   Have a wonderful day and week. With Gratitude, Gilmore Laroche MSN, FNP-BC

## 2023-03-05 NOTE — Assessment & Plan Note (Signed)
The patient would like to discuss weight loss options She admits to minimal physical activity Encouraged lifestyle changes with a heart healthy diet and increase physical activity My weight management plan reviewed Wt Readings from Last 3 Encounters:  03/05/23 264 lb 0.6 oz (119.8 kg)  02/10/23 260 lb 0.6 oz (118 kg)  01/08/23 262 lb 1.9 oz (118.9 kg)

## 2023-03-05 NOTE — Progress Notes (Signed)
Established Patient Office Visit  Subjective:  Patient ID: Linda Rollins, female    DOB: 1971-03-24  Age: 53 y.o. MRN: [id number]  CC:  Chief Complaint  Patient presents with   Obesity    Pt would like to discuss weight loss, reports she has been trying on her own does fasting, has been doing a lot fruits avoiding carbs and eliminated soft drinks, tries to do some exercise but due to health condition with her back she is not able to do much exercise.     HPI Linda Rollins is a 52 y.o. female presents to discuss weight loss options. For the details of today's visit, please refer to the assessment and plan.       Past Medical History:  Diagnosis Date   Dizziness    Thyroid disease    Vertigo     Past Surgical History:  Procedure Laterality Date   cortisone shots      feet and knees @ [facility]; Dr. Victorino Dike (feet) and Dr. Ranell Patrick (knees)   DENTAL SURGERY     wisdom teeth removal   LEEP      Family History  Problem Relation Age of Onset   Pulmonary embolism Mother    High blood pressure Other        father's side   Diabetes Other        mother's side   Heart disease Other        mother's side    Social History   Socioeconomic History   Marital status: Single    Spouse name: Not on file   Number of children: 1   Years of education: Not on file   Highest education level: 12th grade  Occupational History   Not on file  Tobacco Use   Smoking status: Never   Smokeless tobacco: Never  Vaping Use   Vaping Use: Never used  Substance and Sexual Activity   Alcohol use: Never   Drug use: Never   Sexual activity: Not on file  Other Topics Concern   Not on file  Social History Narrative   Lives at home with her son   Right handed   Caffeine: no   Social Determinants of Health   Financial Resource Strain: Medium Risk (02/06/2023)   Overall Financial Resource Strain (CARDIA)    Difficulty of Paying Living Expenses: Somewhat hard  Food Insecurity: Food  Insecurity Present (02/06/2023)   Hunger Vital Sign    Worried About Running Out of Food in the Last Year: Sometimes true    Ran Out of Food in the Last Year: Sometimes true  Transportation Needs: Unmet Transportation Needs (02/06/2023)   PRAPARE - Administrator, Civil Service (Medical): Yes    Lack of Transportation (Non-Medical): No  Physical Activity: Inactive (02/06/2023)   Exercise Vital Sign    Days of Exercise per Week: 0 days    Minutes of Exercise per Session: 30 min  Stress: Stress Concern Present (02/06/2023)   [employer] of Occupational Health - Occupational Stress Questionnaire    Feeling of Stress : Rather much  Social Connections: Socially Isolated (02/06/2023)   Social Connection and Isolation Panel [NHANES]    Frequency of Communication with Friends and Family: Twice a week    Frequency of Social Gatherings with Friends and Family: Never    Attends Religious Services: Never    Database administrator or Organizations: No    Attends Banker Meetings: Never  Marital Status: Never married  Intimate Partner Violence: Not At Risk (01/08/2023)   Humiliation, Afraid, Rape, and Kick questionnaire    Fear of Current or Ex-Partner: No    Emotionally Abused: No    Physically Abused: No    Sexually Abused: No    Outpatient Medications Prior to Visit  Medication Sig Dispense Refill   ibuprofen (ADVIL,MOTRIN) 200 MG tablet Take 600 mg by mouth daily as needed for moderate pain.      levothyroxine (SYNTHROID) 112 MCG tablet TAKE 1 TABLET BY MOUTH DAILY 30 tablet 2   No facility-administered medications prior to visit.    No Known Allergies  ROS Review of Systems  Constitutional:  Negative for chills and fever.  Eyes:  Negative for visual disturbance.  Respiratory:  Negative for chest tightness and shortness of breath.   Neurological:  Negative for dizziness and headaches.      Objective:    Physical Exam Constitutional:       Appearance: She is obese.  HENT:     Head: Normocephalic.     Mouth/Throat:     Mouth: Mucous membranes are moist.  Cardiovascular:     Rate and Rhythm: Normal rate.     Heart sounds: Normal heart sounds.  Pulmonary:     Effort: Pulmonary effort is normal.     Breath sounds: Normal breath sounds.  Neurological:     Mental Status: She is alert.     BP 135/89   Pulse 96   Ht 5\' 3"  (1.6 m)   Wt 264 lb 0.6 oz (119.8 kg)   LMP 06/29/2017   SpO2 92%   BMI 46.77 kg/m  Wt Readings from Last 3 Encounters:  03/05/23 264 lb 0.6 oz (119.8 kg)  02/10/23 260 lb 0.6 oz (118 kg)  01/08/23 262 lb 1.9 oz (118.9 kg)    Lab Results  Component Value Date   TSH 1.290 11/21/2022   Lab Results  Component Value Date   WBC 9.4 11/21/2022   HGB 13.5 11/21/2022   HCT 41.0 11/21/2022   MCV 88 11/21/2022   PLT 365 11/21/2022   Lab Results  Component Value Date   NA 140 11/21/2022   K 4.6 11/21/2022   CO2 18 (L) 11/21/2022   GLUCOSE 93 11/21/2022   BUN 15 11/21/2022   CREATININE 0.78 11/21/2022   BILITOT 0.6 11/21/2022   ALKPHOS 104 11/21/2022   AST 22 11/21/2022   ALT 27 11/21/2022   PROT 6.3 11/21/2022   ALBUMIN 4.3 11/21/2022   CALCIUM 9.7 11/21/2022   ANIONGAP 9 03/04/2019   EGFR 91 11/21/2022   Lab Results  Component Value Date   CHOL 233 (H) 11/21/2022   Lab Results  Component Value Date   HDL 45 11/21/2022   Lab Results  Component Value Date   LDLCALC 171 (H) 11/21/2022   Lab Results  Component Value Date   TRIG 96 11/21/2022   Lab Results  Component Value Date   CHOLHDL 5.2 (H) 11/21/2022   Lab Results  Component Value Date   HGBA1C 5.0 11/21/2022      Assessment & Plan:  Morbid obesity (HCC) Assessment & Plan: The patient would like to discuss weight loss options She admits to minimal physical activity Encouraged lifestyle changes with a heart healthy diet and increase physical activity My weight management plan reviewed Wt Readings from Last 3  Encounters:  03/05/23 264 lb 0.6 oz (119.8 kg)  02/10/23 260 lb 0.6 oz (118 kg)  01/08/23  262 lb 1.9 oz (118.9 kg)       Note: This chart has been completed using Engineer, civil (consulting) software, and while attempts have been made to ensure accuracy, certain words and phrases may not be transcribed as intended.    Follow-up: Return in about 2 months (around 05/12/2023).   Gilmore Laroche, FNP

## 2023-05-12 ENCOUNTER — Ambulatory Visit (INDEPENDENT_AMBULATORY_CARE_PROVIDER_SITE_OTHER): Payer: Medicare HMO | Admitting: Family Medicine

## 2023-05-12 ENCOUNTER — Encounter: Payer: Self-pay | Admitting: Family Medicine

## 2023-05-12 VITALS — BP 122/85 | HR 139 | Ht 63.0 in | Wt 243.0 lb

## 2023-05-12 DIAGNOSIS — E038 Other specified hypothyroidism: Secondary | ICD-10-CM | POA: Diagnosis not present

## 2023-05-12 DIAGNOSIS — E7849 Other hyperlipidemia: Secondary | ICD-10-CM | POA: Diagnosis not present

## 2023-05-12 DIAGNOSIS — R7301 Impaired fasting glucose: Secondary | ICD-10-CM

## 2023-05-12 DIAGNOSIS — E559 Vitamin D deficiency, unspecified: Secondary | ICD-10-CM

## 2023-05-12 NOTE — Assessment & Plan Note (Signed)
She takes Synthroid 112 mcg daily Patient is compliant with treatment regimen Will assess thyroid levels today Lab Results  Component Value Date   TSH 1.290 11/21/2022

## 2023-05-12 NOTE — Progress Notes (Signed)
Established Patient Office Visit  Subjective:  Patient ID: Linda Rollins, female    DOB: May 23, 1971  Age: 52 y.o. MRN: [id number]  CC:  Chief Complaint  Patient presents with   Obesity    Follow up for weight management.     HPI Linda Rollins is a 52 y.o. female with past medical history of hypothyroidism presents for f/u of  chronic medical conditions. For the details of today's visit, please refer to the assessment and plan.     Past Medical History:  Diagnosis Date   Dizziness    Thyroid disease    Vertigo     Past Surgical History:  Procedure Laterality Date   cortisone shots      feet and knees @ [facility]; Dr. Victorino Dike (feet) and Dr. Ranell Patrick (knees)   DENTAL SURGERY     wisdom teeth removal   LEEP      Family History  Problem Relation Age of Onset   Pulmonary embolism Mother    High blood pressure Other        father's side   Diabetes Other        mother's side   Heart disease Other        mother's side    Social History   Socioeconomic History   Marital status: Single    Spouse name: Not on file   Number of children: 1   Years of education: Not on file   Highest education level: 12th grade  Occupational History   Not on file  Tobacco Use   Smoking status: Never   Smokeless tobacco: Never  Vaping Use   Vaping status: Never Used  Substance and Sexual Activity   Alcohol use: Never   Drug use: Never   Sexual activity: Not on file  Other Topics Concern   Not on file  Social History Narrative   Lives at home with her son   Right handed   Caffeine: no   Social Determinants of Health   Financial Resource Strain: Medium Risk (02/06/2023)   Overall Financial Resource Strain (CARDIA)    Difficulty of Paying Living Expenses: Somewhat hard  Food Insecurity: Food Insecurity Present (02/06/2023)   Hunger Vital Sign    Worried About Running Out of Food in the Last Year: Sometimes true    Ran Out of Food in the Last Year: Sometimes true   Transportation Needs: Unmet Transportation Needs (02/06/2023)   PRAPARE - Administrator, Civil Service (Medical): Yes    Lack of Transportation (Non-Medical): No  Physical Activity: Inactive (02/06/2023)   Exercise Vital Sign    Days of Exercise per Week: 0 days    Minutes of Exercise per Session: 30 min  Stress: Stress Concern Present (02/06/2023)   [employer] of Occupational Health - Occupational Stress Questionnaire    Feeling of Stress : Rather much  Social Connections: Socially Isolated (02/06/2023)   Social Connection and Isolation Panel [NHANES]    Frequency of Communication with Friends and Family: Twice a week    Frequency of Social Gatherings with Friends and Family: Never    Attends Religious Services: Never    Database administrator or Organizations: No    Attends Banker Meetings: Never    Marital Status: Never married  Intimate Partner Violence: Not At Risk (01/08/2023)   Humiliation, Afraid, Rape, and Kick questionnaire    Fear of Current or Ex-Partner: No    Emotionally Abused: No  Physically Abused: No    Sexually Abused: No    Outpatient Medications Prior to Visit  Medication Sig Dispense Refill   ibuprofen (ADVIL,MOTRIN) 200 MG tablet Take 600 mg by mouth daily as needed for moderate pain.      levothyroxine (SYNTHROID) 112 MCG tablet TAKE 1 TABLET BY MOUTH DAILY 30 tablet 2   No facility-administered medications prior to visit.    No Known Allergies  ROS Review of Systems  Constitutional:  Negative for chills and fever.  Eyes:  Negative for visual disturbance.  Respiratory:  Negative for chest tightness and shortness of breath.   Neurological:  Negative for dizziness and headaches.      Objective:    Physical Exam HENT:     Head: Normocephalic.     Mouth/Throat:     Mouth: Mucous membranes are moist.  Cardiovascular:     Rate and Rhythm: Normal rate.     Heart sounds: Normal heart sounds.  Pulmonary:      Effort: Pulmonary effort is normal.     Breath sounds: Normal breath sounds.  Neurological:     Mental Status: She is alert.     BP 122/85   Pulse (!) 139   Ht 5\' 3"  (1.6 m)   Wt 243 lb 0.6 oz (110.2 kg)   LMP 06/29/2017   SpO2 94%   BMI 43.05 kg/m  Wt Readings from Last 3 Encounters:  05/12/23 243 lb 0.6 oz (110.2 kg)  03/05/23 264 lb 0.6 oz (119.8 kg)  02/10/23 260 lb 0.6 oz (118 kg)    Lab Results  Component Value Date   TSH 1.290 11/21/2022   Lab Results  Component Value Date   WBC 9.4 11/21/2022   HGB 13.5 11/21/2022   HCT 41.0 11/21/2022   MCV 88 11/21/2022   PLT 365 11/21/2022   Lab Results  Component Value Date   NA 140 11/21/2022   K 4.6 11/21/2022   CO2 18 (L) 11/21/2022   GLUCOSE 93 11/21/2022   BUN 15 11/21/2022   CREATININE 0.78 11/21/2022   BILITOT 0.6 11/21/2022   ALKPHOS 104 11/21/2022   AST 22 11/21/2022   ALT 27 11/21/2022   PROT 6.3 11/21/2022   ALBUMIN 4.3 11/21/2022   CALCIUM 9.7 11/21/2022   ANIONGAP 9 03/04/2019   EGFR 91 11/21/2022   Lab Results  Component Value Date   CHOL 233 (H) 11/21/2022   Lab Results  Component Value Date   HDL 45 11/21/2022   Lab Results  Component Value Date   LDLCALC 171 (H) 11/21/2022   Lab Results  Component Value Date   TRIG 96 11/21/2022   Lab Results  Component Value Date   CHOLHDL 5.2 (H) 11/21/2022   Lab Results  Component Value Date   HGBA1C 5.0 11/21/2022      Assessment & Plan:  Other specified hypothyroidism Assessment & Plan: She takes Synthroid 112 mcg daily Patient is compliant with treatment regimen Will assess thyroid levels today Lab Results  Component Value Date   TSH 1.290 11/21/2022     Orders: -     TSH + free T4  IFG (impaired fasting glucose) -     Hemoglobin A1c  Vitamin D deficiency -     VITAMIN D 25 Hydroxy (Vit-D Deficiency, Fractures)  Other hyperlipidemia -     Lipid panel -     CMP14+EGFR -     CBC with Differential/Platelet   Note:  This chart has been completed using Dragon  Medical Dictation software, and while attempts have been made to ensure accuracy, certain words and phrases may not be transcribed as intended.   Follow-up: Return in about 4 months (around 09/12/2023).   Gilmore Laroche, FNP

## 2023-05-12 NOTE — Patient Instructions (Signed)
I appreciate the opportunity to provide care to you today!    Follow up:  4 months  Fasting Labs: please stop by the lab during the week to get your blood drawn (CBC, CMP, TSH, Lipid profile, HgA1c, Vit D)  Please stop by your local pharmacy and get your Shingles vaccine   Attached with your AVS, you will find valuable resources for self-education. I highly recommend dedicating some time to thoroughly examine them.   Please continue to a heart-healthy diet and increase your physical activities. Try to exercise for at least five days a week.    It was a pleasure to see you and I look forward to continuing to work together on your health and well-being. Please do not hesitate to call the office if you need care or have questions about your care.  In case of emergency, please visit the Emergency Department for urgent care, or contact our clinic at [phone] to schedule an appointment. We're here to help you!   Have a wonderful day and week. With Gratitude, Gilmore Laroche MSN, FNP-BC

## 2023-05-13 DIAGNOSIS — R7301 Impaired fasting glucose: Secondary | ICD-10-CM | POA: Diagnosis not present

## 2023-05-13 DIAGNOSIS — E559 Vitamin D deficiency, unspecified: Secondary | ICD-10-CM | POA: Diagnosis not present

## 2023-05-13 DIAGNOSIS — E038 Other specified hypothyroidism: Secondary | ICD-10-CM | POA: Diagnosis not present

## 2023-05-13 DIAGNOSIS — E7849 Other hyperlipidemia: Secondary | ICD-10-CM | POA: Diagnosis not present

## 2023-05-14 ENCOUNTER — Other Ambulatory Visit: Payer: Self-pay | Admitting: Family Medicine

## 2023-05-14 LAB — CMP14+EGFR
ALT: 23 IU/L (ref 0–32)
AST: 22 IU/L (ref 0–40)
Albumin: 4.3 g/dL (ref 3.8–4.9)
Alkaline Phosphatase: 107 IU/L (ref 44–121)
BUN/Creatinine Ratio: 13 (ref 9–23)
BUN: 12 mg/dL (ref 6–24)
Bilirubin Total: 0.7 mg/dL (ref 0.0–1.2)
CO2: 18 mmol/L — ABNORMAL LOW (ref 20–29)
Calcium: 9.9 mg/dL (ref 8.7–10.2)
Chloride: 104 mmol/L (ref 96–106)
Creatinine, Ser: 0.94 mg/dL (ref 0.57–1.00)
Globulin, Total: 2 g/dL (ref 1.5–4.5)
Glucose: 103 mg/dL — ABNORMAL HIGH (ref 70–99)
Potassium: 4.2 mmol/L (ref 3.5–5.2)
Sodium: 139 mmol/L (ref 134–144)
Total Protein: 6.3 g/dL (ref 6.0–8.5)
eGFR: 73 mL/min/{1.73_m2} (ref >59)

## 2023-05-14 LAB — TSH+FREE T4
Free T4: 1.65 ng/dL (ref 0.82–1.77)
TSH: 1.5 u[IU]/mL (ref 0.450–4.500)

## 2023-05-14 LAB — HEMOGLOBIN A1C
Est. average glucose Bld gHb Est-mCnc: 94 mg/dL
Hgb A1c MFr Bld: 4.9 % (ref 4.8–5.6)

## 2023-05-14 LAB — LIPID PANEL
Chol/HDL Ratio: 5.8 ratio — ABNORMAL HIGH (ref 0.0–4.4)
Cholesterol, Total: 228 mg/dL — ABNORMAL HIGH (ref 100–199)
HDL: 39 mg/dL — ABNORMAL LOW (ref >39)
LDL Chol Calc (NIH): 155 mg/dL — ABNORMAL HIGH (ref 0–99)
Triglycerides: 185 mg/dL — ABNORMAL HIGH (ref 0–149)
VLDL Cholesterol Cal: 34 mg/dL (ref 5–40)

## 2023-05-14 LAB — CBC WITH DIFFERENTIAL/PLATELET
Basophils Absolute: 0.1 10*3/uL (ref 0.0–0.2)
Basos: 1 %
EOS (ABSOLUTE): 0.2 10*3/uL (ref 0.0–0.4)
Eos: 2 %
Hematocrit: 43.5 % (ref 34.0–46.6)
Hemoglobin: 14.2 g/dL (ref 11.1–15.9)
Immature Grans (Abs): 0.1 10*3/uL (ref 0.0–0.1)
Immature Granulocytes: 1 %
Lymphocytes Absolute: 4.1 10*3/uL — ABNORMAL HIGH (ref 0.7–3.1)
Lymphs: 42 %
MCH: 29 pg (ref 26.6–33.0)
MCHC: 32.6 g/dL (ref 31.5–35.7)
MCV: 89 fL (ref 79–97)
Monocytes Absolute: 0.5 10*3/uL (ref 0.1–0.9)
Monocytes: 6 %
Neutrophils Absolute: 4.8 10*3/uL (ref 1.4–7.0)
Neutrophils: 48 %
Platelets: 437 10*3/uL (ref 150–450)
RBC: 4.9 x10E6/uL (ref 3.77–5.28)
RDW: 12.2 % (ref 11.7–15.4)
WBC: 9.7 10*3/uL (ref 3.4–10.8)

## 2023-05-14 LAB — VITAMIN D 25 HYDROXY (VIT D DEFICIENCY, FRACTURES): Vit D, 25-Hydroxy: 33.2 ng/mL (ref 30.0–100.0)

## 2023-05-14 NOTE — Progress Notes (Signed)
The 10-year ASCVD risk score (Arnett DK, et al., 2019) is: 3.7%   Values used to calculate the score:     Age: 52 years     Sex: Female     Is Non-Hispanic African American: Yes     Diabetic: No     Tobacco smoker: No     Systolic Blood Pressure: 122 mmHg     Is BP treated: No     HDL Cholesterol: 39 mg/dL     Total Cholesterol: 228 mg/dL

## 2023-06-01 ENCOUNTER — Other Ambulatory Visit: Payer: Self-pay | Admitting: Family Medicine

## 2023-06-10 DIAGNOSIS — M7918 Myalgia, other site: Secondary | ICD-10-CM | POA: Diagnosis not present

## 2023-06-10 DIAGNOSIS — G603 Idiopathic progressive neuropathy: Secondary | ICD-10-CM | POA: Diagnosis not present

## 2023-06-10 DIAGNOSIS — M7912 Myalgia of auxiliary muscles, head and neck: Secondary | ICD-10-CM | POA: Diagnosis not present

## 2023-06-10 DIAGNOSIS — Z79899 Other long term (current) drug therapy: Secondary | ICD-10-CM | POA: Diagnosis not present

## 2023-07-24 DIAGNOSIS — D485 Neoplasm of uncertain behavior of skin: Secondary | ICD-10-CM | POA: Diagnosis not present

## 2023-07-24 DIAGNOSIS — D171 Benign lipomatous neoplasm of skin and subcutaneous tissue of trunk: Secondary | ICD-10-CM | POA: Diagnosis not present

## 2023-07-24 DIAGNOSIS — L538 Other specified erythematous conditions: Secondary | ICD-10-CM | POA: Diagnosis not present

## 2023-08-15 ENCOUNTER — Other Ambulatory Visit: Payer: Self-pay | Admitting: Family Medicine

## 2023-08-15 ENCOUNTER — Emergency Department (HOSPITAL_COMMUNITY): Payer: Medicare HMO

## 2023-08-15 ENCOUNTER — Emergency Department (HOSPITAL_COMMUNITY)
Admission: EM | Admit: 2023-08-15 | Discharge: 2023-08-15 | Disposition: A | Discharge status: Home / Self Care | Payer: Medicare HMO | Attending: Emergency Medicine | Admitting: Emergency Medicine

## 2023-08-15 ENCOUNTER — Encounter (HOSPITAL_COMMUNITY): Payer: Self-pay | Admitting: *Deleted

## 2023-08-15 ENCOUNTER — Other Ambulatory Visit: Payer: Self-pay

## 2023-08-15 DIAGNOSIS — R0602 Shortness of breath: Secondary | ICD-10-CM | POA: Diagnosis not present

## 2023-08-15 DIAGNOSIS — R0789 Other chest pain: Secondary | ICD-10-CM | POA: Diagnosis not present

## 2023-08-15 DIAGNOSIS — Z1231 Encounter for screening mammogram for malignant neoplasm of breast: Secondary | ICD-10-CM

## 2023-08-15 DIAGNOSIS — I2510 Atherosclerotic heart disease of native coronary artery without angina pectoris: Secondary | ICD-10-CM | POA: Diagnosis not present

## 2023-08-15 DIAGNOSIS — R079 Chest pain, unspecified: Secondary | ICD-10-CM | POA: Insufficient documentation

## 2023-08-15 DIAGNOSIS — D72822 Plasmacytosis: Secondary | ICD-10-CM | POA: Insufficient documentation

## 2023-08-15 DIAGNOSIS — R000 Tachycardia, unspecified: Secondary | ICD-10-CM | POA: Insufficient documentation

## 2023-08-15 DIAGNOSIS — Z79899 Other long term (current) drug therapy: Secondary | ICD-10-CM | POA: Diagnosis not present

## 2023-08-15 DIAGNOSIS — E039 Hypothyroidism, unspecified: Secondary | ICD-10-CM | POA: Insufficient documentation

## 2023-08-15 DIAGNOSIS — E119 Type 2 diabetes mellitus without complications: Secondary | ICD-10-CM | POA: Insufficient documentation

## 2023-08-15 DIAGNOSIS — I10 Essential (primary) hypertension: Secondary | ICD-10-CM | POA: Diagnosis not present

## 2023-08-15 DIAGNOSIS — K760 Fatty (change of) liver, not elsewhere classified: Secondary | ICD-10-CM | POA: Diagnosis not present

## 2023-08-15 DIAGNOSIS — K8020 Calculus of gallbladder without cholecystitis without obstruction: Secondary | ICD-10-CM | POA: Diagnosis not present

## 2023-08-15 LAB — COMPREHENSIVE METABOLIC PANEL
ALT: 18 U/L (ref 0–44)
AST: 21 U/L (ref 15–41)
Albumin: 4 g/dL (ref 3.5–5.0)
Alkaline Phosphatase: 86 U/L (ref 38–126)
Anion gap: 11 (ref 5–15)
BUN: 14 mg/dL (ref 6–20)
CO2: 24 mmol/L (ref 22–32)
Calcium: 9.2 mg/dL (ref 8.9–10.3)
Chloride: 103 mmol/L (ref 98–111)
Creatinine, Ser: 0.97 mg/dL (ref 0.44–1.00)
GFR, Estimated: >60 mL/min (ref >60)
Glucose, Bld: 110 mg/dL — ABNORMAL HIGH (ref 70–99)
Potassium: 3.8 mmol/L (ref 3.5–5.1)
Sodium: 138 mmol/L (ref 135–145)
Total Bilirubin: 0.7 mg/dL (ref 0.3–1.2)
Total Protein: 7 g/dL (ref 6.5–8.1)

## 2023-08-15 LAB — CBC WITH DIFFERENTIAL/PLATELET
Abs Immature Granulocytes: 0.06 10*3/uL (ref 0.00–0.07)
Basophils Absolute: 0 10*3/uL (ref 0.0–0.1)
Basophils Relative: 0 %
Eosinophils Absolute: 0.2 10*3/uL (ref 0.0–0.5)
Eosinophils Relative: 1 %
HCT: 41.2 % (ref 36.0–46.0)
Hemoglobin: 13.6 g/dL (ref 12.0–15.0)
Immature Granulocytes: 1 %
Lymphocytes Relative: 23 %
Lymphs Abs: 3 10*3/uL (ref 0.7–4.0)
MCH: 29.4 pg (ref 26.0–34.0)
MCHC: 33 g/dL (ref 30.0–36.0)
MCV: 89.2 fL (ref 80.0–100.0)
Monocytes Absolute: 0.6 10*3/uL (ref 0.1–1.0)
Monocytes Relative: 5 %
Neutro Abs: 9 10*3/uL — ABNORMAL HIGH (ref 1.7–7.7)
Neutrophils Relative %: 70 %
Platelets: 388 10*3/uL (ref 150–400)
RBC: 4.62 MIL/uL (ref 3.87–5.11)
RDW: 12.1 % (ref 11.5–15.5)
WBC: 12.9 10*3/uL — ABNORMAL HIGH (ref 4.0–10.5)
nRBC: 0 % (ref 0.0–0.2)

## 2023-08-15 LAB — TROPONIN I (HIGH SENSITIVITY): Troponin I (High Sensitivity): <2 ng/L (ref <18)

## 2023-08-15 MED ORDER — IOHEXOL 350 MG/ML SOLN
75.0000 mL | Freq: Once | INTRAVENOUS | Status: AC | PRN
  Administered 2023-08-15: 75 mL via INTRAVENOUS

## 2023-08-15 NOTE — ED Provider Notes (Signed)
[place] EMERGENCY DEPARTMENT AT [facility] Provider Note   CSN: [id number] Arrival date & time: 08/15/23  1710     History  Chief Complaint  Patient presents with   Chest Pain    Linda Rollins is a 52 y.o. female.   Chest Pain  Pt with no hx of CAD or DM, or Htn, has had CP that started last night - intermittent today - and then was associated with a feeling of facial fullness and tightness.  Occurred while she was walking, it is since completely resolved and she is symptom-free at this time.  At no time did she have shortness of breath, swelling of the legs, coughing fever back pain belly pain or any other symptoms.  She has never had cardiac disease, her mother did die of a pulmonary embolism, this patient has not had any recent travel trauma surgery immobilization hormone pills tobacco use or history of cancer.    Home Medications Prior to Admission medications   Medication Sig Start Date End Date Taking? Authorizing Provider  ibuprofen (ADVIL,MOTRIN) 200 MG tablet Take 600 mg by mouth daily as needed for moderate pain.     [provider]  levothyroxine (SYNTHROID) 112 MCG tablet TAKE 1 TABLET BY MOUTH DAILY 06/02/23   Gilmore Laroche, FNP      Allergies    Patient has no known allergies.    Review of Systems   Review of Systems  Cardiovascular:  Positive for chest pain.  All other systems reviewed and are negative.   Physical Exam Updated Vital Signs BP 120/80   Pulse 99   Temp 97.7 F (36.5 C) (Oral)   Resp (!) 23   Ht 1.6 m (5\' 3" )   Wt 117.9 kg   LMP 06/29/2017   SpO2 96%   BMI 46.06 kg/m  Physical Exam Vitals and nursing note reviewed.  Constitutional:      General: She is not in acute distress.    Appearance: She is well-developed.  HENT:     Head: Normocephalic and atraumatic.     Mouth/Throat:     Pharynx: No oropharyngeal exudate.  Eyes:     General: No scleral icterus.       Right eye: No discharge.        Left eye:  No discharge.     Conjunctiva/sclera: Conjunctivae normal.     Pupils: Pupils are equal, round, and reactive to light.  Neck:     Thyroid: No thyromegaly.     Vascular: No JVD.  Cardiovascular:     Rate and Rhythm: Regular rhythm. Tachycardia present.     Heart sounds: Normal heart sounds. No murmur heard.    No friction rub. No gallop.  Pulmonary:     Effort: Pulmonary effort is normal. No respiratory distress.     Breath sounds: Normal breath sounds. No wheezing or rales.  Abdominal:     General: Bowel sounds are normal. There is no distension.     Palpations: Abdomen is soft. There is no mass.     Tenderness: There is no abdominal tenderness.  Musculoskeletal:        General: No tenderness. Normal range of motion.     Cervical back: Normal range of motion and neck supple.     Right lower leg: No tenderness. No edema.     Left lower leg: No tenderness. No edema.  Lymphadenopathy:     Cervical: No cervical adenopathy.  Skin:    General: Skin  is warm and dry.     Findings: No erythema or rash.  Neurological:     Mental Status: She is alert.     Coordination: Coordination normal.  Psychiatric:        Behavior: Behavior normal.     ED Results / Procedures / Treatments   Labs (all labs ordered are listed, but only abnormal results are displayed) Labs Reviewed  CBC WITH DIFFERENTIAL/PLATELET - Abnormal; Notable for the following components:      Result Value   WBC 12.9 (*)    Neutro Abs 9.0 (*)    All other components within normal limits  COMPREHENSIVE METABOLIC PANEL - Abnormal; Notable for the following components:   Glucose, Bld 110 (*)    All other components within normal limits  TROPONIN I (HIGH SENSITIVITY)    EKG EKG Interpretation Date/Time:  Friday August 15 2023 17:20:53 EDT Ventricular Rate:  121 PR Interval:  160 QRS Duration:  78 QT Interval:  318 QTC Calculation: 451 R Axis:   22  Text Interpretation: Sinus tachycardia Cannot rule out Anterior  infarct , age undetermined Abnormal ECG When compared with ECG of 04-Mar-2019 16:22, PREVIOUS ECG IS PRESENT Confirmed by Eber Hong ([id number]) on 08/15/2023 5:39:39 PM  Radiology CT Angio Chest PE W and/or Wo Contrast  Result Date: 08/15/2023 CLINICAL DATA:  Left-sided chest pain positive lightheaded EXAM: CT ANGIOGRAPHY CHEST WITH CONTRAST TECHNIQUE: Multidetector CT imaging of the chest was performed using the standard protocol during bolus administration of intravenous contrast. Multiplanar CT image reconstructions and MIPs were obtained to evaluate the vascular anatomy. RADIATION DOSE REDUCTION: This exam was performed according to the departmental dose-optimization program which includes automated exposure control, adjustment of the mA and/or kV according to patient size and/or use of iterative reconstruction technique. CONTRAST:  75mL OMNIPAQUE IOHEXOL 350 MG/ML SOLN COMPARISON:  Chest x-ray 08/15/2023 FINDINGS: Cardiovascular: Satisfactory opacification of the pulmonary arteries to the segmental level. No evidence of pulmonary embolism. Normal heart size. No pericardial effusion. Nonaneurysmal aorta. No dissection is seen Mediastinum/Nodes: No enlarged mediastinal, hilar, or axillary lymph nodes. Thyroid gland, trachea, and esophagus demonstrate no significant findings. Lungs/Pleura: Lungs are clear. No pleural effusion or pneumothorax. Upper Abdomen: Hepatic steatosis.  Gallstones. Musculoskeletal: No chest wall abnormality. No acute or significant osseous findings. Review of the MIP images confirms the above findings. IMPRESSION: 1. Negative for acute pulmonary embolus or aortic dissection. 2. Hepatic steatosis. Gallstones. Electronically Signed   By: Jasmine Pang M.D.   On: 08/15/2023 19:57   DG Chest 2 View  Result Date: 08/15/2023 CLINICAL DATA:  Left-sided chest pain EXAM: CHEST - 2 VIEW COMPARISON:  12/17/2013 FINDINGS: The heart size and mediastinal contours are within normal limits. Both  lungs are clear. Dextroscoliosis. IMPRESSION: No active cardiopulmonary disease. Electronically Signed   By: Jasmine Pang M.D.   On: 08/15/2023 18:26    Procedures Procedures    Medications Ordered in ED Medications  iohexol (OMNIPAQUE) 350 MG/ML injection 75 mL (75 mLs Intravenous Contrast Given 08/15/23 1904)    ED Course/ Medical Decision Making/ A&P                                 Medical Decision Making Amount and/or Complexity of Data Reviewed Labs: ordered. Radiology: ordered.  Risk Prescription drug management.    This patient presents to the ED for concern of chest pain, this involves an extensive number of treatment options, and  is a complaint that carries with it a high risk of complications and morbidity.  The differential diagnosis includes coronary disease, pulmonary embolism, pneumothorax, anxiety, anemia   Co morbidities that complicate the patient evaluation  Overweight, history of hypothyroidism on Synthroid last checked a couple of months ago under good control with her doctor   Additional history obtained:  Additional history obtained from medical record External records from outside source obtained and reviewed including patient was seen by her nurse practitioner as recently as July 2024, TSH at that time was normal and the free T4 was normal   Lab Tests:  I Ordered, and personally interpreted labs.  The pertinent results include: Cabbell workup including troponin, CBC shows a mild leukocytosis of 12,900 and metabolic panel is normal.  Imaging Studies ordered:  I ordered imaging studies including CT angiogram I independently visualized and interpreted imaging which showed no signs of pulmonary embolism or dissection I agree with the radiologist interpretation   Cardiac Monitoring: / EKG:  The patient was maintained on a cardiac monitor.  I personally viewed and interpreted the cardiac monitored which showed an underlying rhythm of: Sinus  tachycardia, EKG personally interpreted by myself, rate of 121 bpm and what appears to be sinus tachycardia with normal axis, normal intervals normal ST segments and normal T waves, there is some poor R wave progression but otherwise unremarkable sinus tachycardia EKG    Problem List / ED Course / Critical interventions / Medication management I have reviewed the patients home medicines and have made adjustments as needed   Social Determinants of Health:  None   Test / Admission - Considered:  Considered admission but the patient is low risk with a negative workup and normal vital signs and EKG, stable for discharge         Final Clinical Impression(s) / ED Diagnoses Final diagnoses:  Chest pain, unspecified type    Rx / DC Orders ED Discharge Orders     None         Eber Hong, MD 08/15/23 2002

## 2023-08-15 NOTE — Discharge Instructions (Signed)
Your testing is totally normal, there is no signs of blood clot, no signs of heart attack, I do want you to follow-up with your family doctor if this continues but come back to the ER for worsening symptoms

## 2023-08-15 NOTE — ED Triage Notes (Signed)
Pt with left CP since last night.  Denies SOB or N/V. + lightheadedness last night

## 2023-08-21 ENCOUNTER — Ambulatory Visit: Payer: Medicare HMO | Admitting: Orthopedic Surgery

## 2023-08-27 ENCOUNTER — Other Ambulatory Visit: Payer: Self-pay | Admitting: Family Medicine

## 2023-08-28 ENCOUNTER — Other Ambulatory Visit: Payer: Self-pay

## 2023-08-28 ENCOUNTER — Encounter: Payer: Self-pay | Admitting: Orthopedic Surgery

## 2023-08-28 ENCOUNTER — Ambulatory Visit: Payer: Medicare HMO | Admitting: Orthopedic Surgery

## 2023-08-28 ENCOUNTER — Other Ambulatory Visit (INDEPENDENT_AMBULATORY_CARE_PROVIDER_SITE_OTHER): Payer: Self-pay

## 2023-08-28 VITALS — BP 121/89 | HR 106 | Ht 63.0 in | Wt 260.0 lb

## 2023-08-28 DIAGNOSIS — M170 Bilateral primary osteoarthritis of knee: Secondary | ICD-10-CM | POA: Diagnosis not present

## 2023-08-28 DIAGNOSIS — G8929 Other chronic pain: Secondary | ICD-10-CM

## 2023-08-28 NOTE — Progress Notes (Signed)
Office Visit Note   Patient: Linda Rollins           Date of Birth: 18-Jul-1971           MRN: [id number] Visit Date: 08/28/2023 Requested by: Gilmore Laroche, FNP [id number] [place],  [place] [address] PCP: Gilmore Laroche, FNP   Assessment & Plan:   Encounter Diagnoses  Name Primary?   Bilateral chronic knee pain    Bilateral primary osteoarthritis of knee Yes    No orders of the defined types were placed in this encounter.   We had a nice discussion about medication and decided to take Tylenol 500 mg every 6 hours as needed for arthritis pain  We put her on a knee strengthening program  We devised her to lose weight  I think she is since she has not been treated this is a reasonable first step to help with her arthritis   Subjective: Chief Complaint  Patient presents with   Knee Pain    Bilateral since 2020 worse past year     HPI: Burning pain medial knee and this 52 year old female who also has some type of neurologic dysfunction currently in treatment.  No history of trauma to the knee.  No surgery has been done on the knee.              ROS: Gait disturbance burning pain in her feet   Images personally read and my interpretation : X-rays show arthritis both knees grade 3 on the left and 2 on the right  Visit Diagnoses:  1. Bilateral primary osteoarthritis of knee   2. Bilateral chronic knee pain      Follow-Up Instructions: No follow-ups on file.    Objective: Vital Signs: BP 121/89   Pulse (!) 106   Ht 5\' 3"  (1.6 m)   Wt 260 lb (117.9 kg)   LMP 06/29/2017   BMI 46.06 kg/m   Physical Exam Vitals and nursing note reviewed.  Constitutional:      Appearance: Normal appearance.  HENT:     Head: Normocephalic and atraumatic.  Eyes:     General: No scleral icterus.       Right eye: No discharge.        Left eye: No discharge.     Extraocular Movements: Extraocular movements intact.     Conjunctiva/sclera: Conjunctivae normal.      Pupils: Pupils are equal, round, and reactive to light.  Cardiovascular:     Rate and Rhythm: Normal rate.     Pulses: Normal pulses.  Musculoskeletal:     Right knee:     Instability Tests: Medial McMurray test negative and lateral McMurray test negative.     Left knee:     Instability Tests: Medial McMurray test negative and lateral McMurray test negative.  Skin:    General: Skin is warm and dry.     Capillary Refill: Capillary refill takes less than 2 seconds.  Neurological:     General: No focal deficit present.     Mental Status: She is alert and oriented to person, place, and time.     Gait: Gait normal.  Psychiatric:        Mood and Affect: Mood normal.        Behavior: Behavior normal.        Thought Content: Thought content normal.        Judgment: Judgment normal.      Right Knee Exam   Muscle Strength  The patient has normal right knee strength.  Tenderness  The patient is experiencing tenderness in the medial joint line.  Range of Motion  Extension:  normal  Flexion:  abnormal   Tests  McMurray:  Medial - negative Lateral - negative Varus: negative Valgus: negative Drawer:  Anterior - negative    Posterior - negative  Other  Erythema: absent Scars: absent Sensation: normal Pulse: present Swelling: none   Left Knee Exam   Muscle Strength  The patient has normal left knee strength.  Tenderness  The patient is experiencing tenderness in the medial joint line.  Range of Motion  Extension:  normal  Flexion:  abnormal   Tests  McMurray:  Medial - negative Lateral - negative Varus: negative Valgus: negative Drawer:  Anterior - negative     Posterior - negative  Other  Erythema: absent Scars: absent Sensation: normal Pulse: present Swelling: none       Specialty Comments:  No specialty comments available.  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   DG Knee AP/LAT W/Sunrise Left  Result Date: 08/28/2023 AP lateral left knee sunrise left knee left knee pain X-ray shows Narrowing of the medial compartment Change in the shape of the femoral condyle Osteophytes on the medial femoral condyle and medial tibial plateau Peaking of the tibial spines Normal alignment of the patella femoral joint medial and lateral condylar osteophytes Right knee also narrowing of the medial compartment osteophyte formation noted on the medial side with peaking of the tibial spine and normal ptf alignment with med lat condylar osteophytes 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     PMFS History: Patient Active Problem List   Diagnosis Date Noted   Morbid obesity (HCC) 03/05/2023   Cervical cancer screening 02/10/2023   Hypothyroidism due to Hashimoto's thyroiditis 11/06/2020   Hashimoto's disease 02/02/2020   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   Myelopathy (HCC) 02/02/2020   Benign paroxysmal positional vertigo of right ear 10/21/2019   Gait abnormality 10/21/2019   Dizziness 10/08/2019   Morbid obesity with BMI of 40.0-44.9, adult (HCC) 10/08/2019   Orthostasis 05/17/2019   Imbalance 12/15/2018   Hypothyroidism 01/22/2013   SHOULDER PAIN 09/04/2009   CERVICALGIA 09/04/2009   IMPINGEMENT SYNDROME 09/04/2009   ANKLE INSTABILITY 08/15/2008   Past Medical History:  Diagnosis Date   Dizziness    Thyroid disease    Vertigo     Family History  Problem Relation  Age of Onset   Pulmonary embolism Mother    High blood pressure Other        father's side   Diabetes Other        mother's side   Heart disease Other        mother's side    Past Surgical History:  Procedure Laterality Date   cortisone shots      feet [facility] @ [facility]; Dr. Victorino Dike (feet) and Dr. Ranell Patrick (knees)   DENTAL SURGERY  wisdom teeth removal   LEEP     Social History   Occupational History   Not on file  Tobacco Use   Smoking status: Never   Smokeless tobacco: Never  Vaping Use   Vaping status: Never Used  Substance and Sexual Activity   Alcohol use: Never   Drug use: Never   Sexual activity: Not on file

## 2023-09-11 ENCOUNTER — Ambulatory Visit: Payer: Medicare HMO

## 2023-09-12 ENCOUNTER — Encounter: Payer: Self-pay | Admitting: Family Medicine

## 2023-09-12 ENCOUNTER — Ambulatory Visit (INDEPENDENT_AMBULATORY_CARE_PROVIDER_SITE_OTHER): Payer: Medicare HMO | Admitting: Family Medicine

## 2023-09-12 VITALS — BP 130/82 | HR 112 | Wt 266.1 lb

## 2023-09-12 DIAGNOSIS — E559 Vitamin D deficiency, unspecified: Secondary | ICD-10-CM | POA: Diagnosis not present

## 2023-09-12 DIAGNOSIS — E038 Other specified hypothyroidism: Secondary | ICD-10-CM | POA: Diagnosis not present

## 2023-09-12 DIAGNOSIS — R7301 Impaired fasting glucose: Secondary | ICD-10-CM | POA: Diagnosis not present

## 2023-09-12 DIAGNOSIS — E7849 Other hyperlipidemia: Secondary | ICD-10-CM

## 2023-09-12 NOTE — Assessment & Plan Note (Signed)
She takes Synthroid 112 mcg daily Patient is compliant with treatment regimen No symptoms of fatigue, unintentional weight gain, and hair loss reported Will assess thyroid levels today Lab Results  Component Value Date   TSH 1.500 05/13/2023

## 2023-09-12 NOTE — Patient Instructions (Signed)
I appreciate the opportunity to provide care to you today!    Follow up:  3 months  Labs: please stop by the lab during the week to get your blood drawn (CBC, CMP, TSH, Lipid profile, HgA1c, Vit D)  Attached with your AVS, you will find valuable resources for self-education. I highly recommend dedicating some time to thoroughly examine them.   Please continue to a heart-healthy diet and increase your physical activities. Try to exercise for at least five days a week.    It was a pleasure to see you and I look forward to continuing to work together on your health and well-being. Please do not hesitate to call the office if you need care or have questions about your care.  In case of emergency, please visit the Emergency Department for urgent care, or contact our clinic at [phone] to schedule an appointment. We're here to help you!   Have a wonderful day and week. With Gratitude, Gilmore Laroche MSN, FNP-BC

## 2023-09-12 NOTE — Progress Notes (Signed)
Established Patient Office Visit  Subjective:  Patient ID: Linda Rollins, female    DOB: December 05, 1970  Age: 52 y.o. MRN: [id number]  CC:  Chief Complaint  Patient presents with   Care Management    4 month f/u, thyroid recheck    HPI Linda Rollins is a 52 y.o. female with past medical history of hypothyroidism presents for f/u of  chronic medical conditions. For the details of today's visit, please refer to the assessment and plan.     Past Medical History:  Diagnosis Date   Dizziness    Thyroid disease    Vertigo     Past Surgical History:  Procedure Laterality Date   cortisone shots      feet and knees @ [facility]; Dr. Victorino Dike (feet) and Dr. Ranell Patrick (knees)   DENTAL SURGERY     wisdom teeth removal   LEEP      Family History  Problem Relation Age of Onset   Pulmonary embolism Mother    High blood pressure Other        father's side   Diabetes Other        mother's side   Heart disease Other        mother's side    Social History   Socioeconomic History   Marital status: Single    Spouse name: Not on file   Number of children: 1   Years of education: Not on file   Highest education level: 12th grade  Occupational History   Not on file  Tobacco Use   Smoking status: Never   Smokeless tobacco: Never  Vaping Use   Vaping status: Never Used  Substance and Sexual Activity   Alcohol use: Never   Drug use: Never   Sexual activity: Not on file  Other Topics Concern   Not on file  Social History Narrative   Lives at home with her son   Right handed   Caffeine: no   Social Determinants of Health   Financial Resource Strain: Medium Risk (02/06/2023)   Overall Financial Resource Strain (CARDIA)    Difficulty of Paying Living Expenses: Somewhat hard  Food Insecurity: Food Insecurity Present (02/06/2023)   Hunger Vital Sign    Worried About Running Out of Food in the Last Year: Sometimes true    Ran Out of Food in the Last Year: Sometimes true   Transportation Needs: Unmet Transportation Needs (02/06/2023)   PRAPARE - Administrator, Civil Service (Medical): Yes    Lack of Transportation (Non-Medical): No  Physical Activity: Inactive (02/06/2023)   Exercise Vital Sign    Days of Exercise per Week: 0 days    Minutes of Exercise per Session: 30 min  Stress: Stress Concern Present (02/06/2023)   [employer] of Occupational Health - Occupational Stress Questionnaire    Feeling of Stress : Rather much  Social Connections: Socially Isolated (02/06/2023)   Social Connection and Isolation Panel [NHANES]    Frequency of Communication with Friends and Family: Twice a week    Frequency of Social Gatherings with Friends and Family: Never    Attends Religious Services: Never    Database administrator or Organizations: No    Attends Banker Meetings: Never    Marital Status: Never married  Intimate Partner Violence: Not At Risk (01/08/2023)   Humiliation, Afraid, Rape, and Kick questionnaire    Fear of Current or Ex-Partner: No    Emotionally Abused: No  Physically Abused: No    Sexually Abused: No    Outpatient Medications Prior to Visit  Medication Sig Dispense Refill   gabapentin (NEURONTIN) 300 MG capsule Take 300 mg by mouth 3 (three) times daily. (Patient not taking: Reported on 08/28/2023)     ibuprofen (ADVIL,MOTRIN) 200 MG tablet Take 600 mg by mouth daily as needed for moderate pain.      levothyroxine (SYNTHROID) 112 MCG tablet TAKE 1 TABLET BY MOUTH DAILY (Patient not taking: Reported on 08/28/2023) 30 tablet 2   levothyroxine (SYNTHROID) 200 MCG tablet 200 mcg.     No facility-administered medications prior to visit.    No Known Allergies  ROS Review of Systems  Constitutional:  Negative for chills and fever.  Eyes:  Negative for visual disturbance.  Respiratory:  Negative for chest tightness and shortness of breath.   Neurological:  Negative for dizziness and headaches.       Objective:    Physical Exam HENT:     Head: Normocephalic.     Mouth/Throat:     Mouth: Mucous membranes are moist.  Cardiovascular:     Rate and Rhythm: Normal rate.     Heart sounds: Normal heart sounds.  Pulmonary:     Effort: Pulmonary effort is normal.     Breath sounds: Normal breath sounds.  Neurological:     Mental Status: She is alert.     BP 130/82   Pulse (!) 112   Wt 266 lb 1.3 oz (120.7 kg)   LMP 06/29/2017   SpO2 95%   BMI 47.13 kg/m  Wt Readings from Last 3 Encounters:  09/12/23 266 lb 1.3 oz (120.7 kg)  08/28/23 260 lb (117.9 kg)  08/15/23 260 lb (117.9 kg)    Lab Results  Component Value Date   TSH 1.500 05/13/2023   Lab Results  Component Value Date   WBC 12.9 (H) 08/15/2023   HGB 13.6 08/15/2023   HCT 41.2 08/15/2023   MCV 89.2 08/15/2023   PLT 388 08/15/2023   Lab Results  Component Value Date   NA 138 08/15/2023   K 3.8 08/15/2023   CO2 24 08/15/2023   GLUCOSE 110 (H) 08/15/2023   BUN 14 08/15/2023   CREATININE 0.97 08/15/2023   BILITOT 0.7 08/15/2023   ALKPHOS 86 08/15/2023   AST 21 08/15/2023   ALT 18 08/15/2023   PROT 7.0 08/15/2023   ALBUMIN 4.0 08/15/2023   CALCIUM 9.2 08/15/2023   ANIONGAP 11 08/15/2023   EGFR 73 05/13/2023   Lab Results  Component Value Date   CHOL 228 (H) 05/13/2023   Lab Results  Component Value Date   HDL 39 (L) 05/13/2023   Lab Results  Component Value Date   LDLCALC 155 (H) 05/13/2023   Lab Results  Component Value Date   TRIG 185 (H) 05/13/2023   Lab Results  Component Value Date   CHOLHDL 5.8 (H) 05/13/2023   Lab Results  Component Value Date   HGBA1C 4.9 05/13/2023      Assessment & Plan:  Other specified hypothyroidism Assessment & Plan: She takes Synthroid 112 mcg daily Patient is compliant with treatment regimen No symptoms of fatigue, unintentional weight gain, and hair loss reported Will assess thyroid levels today Lab Results  Component Value Date   TSH 1.500  05/13/2023     Orders: -     TSH + free T4  IFG (impaired fasting glucose) -     Hemoglobin A1c  Vitamin D deficiency -  VITAMIN D 25 Hydroxy (Vit-D Deficiency, Fractures)  Other hyperlipidemia -     Lipid panel -     CMP14+EGFR -     CBC with Differential/Platelet   Note: This chart has been completed using Engineer, civil (consulting) software, and while attempts have been made to ensure accuracy, certain words and phrases may not be transcribed as intended.   Follow-up: Return in about 3 months (around 12/13/2023).   Gilmore Laroche, FNP

## 2023-09-15 DIAGNOSIS — R7301 Impaired fasting glucose: Secondary | ICD-10-CM | POA: Diagnosis not present

## 2023-09-15 DIAGNOSIS — E038 Other specified hypothyroidism: Secondary | ICD-10-CM | POA: Diagnosis not present

## 2023-09-15 DIAGNOSIS — E7849 Other hyperlipidemia: Secondary | ICD-10-CM | POA: Diagnosis not present

## 2023-09-15 DIAGNOSIS — E559 Vitamin D deficiency, unspecified: Secondary | ICD-10-CM | POA: Diagnosis not present

## 2023-09-16 ENCOUNTER — Telehealth: Payer: Self-pay

## 2023-09-16 LAB — LIPID PANEL
Chol/HDL Ratio: 6.2 ratio — ABNORMAL HIGH (ref 0.0–4.4)
Cholesterol, Total: 273 mg/dL — ABNORMAL HIGH (ref 100–199)
HDL: 44 mg/dL (ref >39)
LDL Chol Calc (NIH): 206 mg/dL — ABNORMAL HIGH (ref 0–99)
Triglycerides: 124 mg/dL (ref 0–149)
VLDL Cholesterol Cal: 23 mg/dL (ref 5–40)

## 2023-09-16 LAB — CMP14+EGFR
ALT: 17 [IU]/L (ref 0–32)
AST: 18 [IU]/L (ref 0–40)
Albumin: 4.4 g/dL (ref 3.8–4.9)
Alkaline Phosphatase: 108 [IU]/L (ref 44–121)
BUN/Creatinine Ratio: 15 (ref 9–23)
BUN: 13 mg/dL (ref 6–24)
Bilirubin Total: 0.5 mg/dL (ref 0.0–1.2)
CO2: 20 mmol/L (ref 20–29)
Calcium: 10.1 mg/dL (ref 8.7–10.2)
Chloride: 103 mmol/L (ref 96–106)
Creatinine, Ser: 0.89 mg/dL (ref 0.57–1.00)
Globulin, Total: 2.2 g/dL (ref 1.5–4.5)
Glucose: 102 mg/dL — ABNORMAL HIGH (ref 70–99)
Potassium: 4.1 mmol/L (ref 3.5–5.2)
Sodium: 140 mmol/L (ref 134–144)
Total Protein: 6.6 g/dL (ref 6.0–8.5)
eGFR: 77 mL/min/{1.73_m2} (ref >59)

## 2023-09-16 LAB — TSH+FREE T4
Free T4: 1.34 ng/dL (ref 0.82–1.77)
TSH: 5.57 u[IU]/mL — ABNORMAL HIGH (ref 0.450–4.500)

## 2023-09-16 LAB — CBC WITH DIFFERENTIAL/PLATELET
Basophils Absolute: 0 10*3/uL (ref 0.0–0.2)
Basos: 0 %
EOS (ABSOLUTE): 0.2 10*3/uL (ref 0.0–0.4)
Eos: 2 %
Hematocrit: 43.4 % (ref 34.0–46.6)
Hemoglobin: 14.4 g/dL (ref 11.1–15.9)
Immature Grans (Abs): 0 10*3/uL (ref 0.0–0.1)
Immature Granulocytes: 0 %
Lymphocytes Absolute: 2.7 10*3/uL (ref 0.7–3.1)
Lymphs: 27 %
MCH: 29.4 pg (ref 26.6–33.0)
MCHC: 33.2 g/dL (ref 31.5–35.7)
MCV: 89 fL (ref 79–97)
Monocytes Absolute: 0.5 10*3/uL (ref 0.1–0.9)
Monocytes: 5 %
Neutrophils Absolute: 6.6 10*3/uL (ref 1.4–7.0)
Neutrophils: 66 %
Platelets: 392 10*3/uL (ref 150–450)
RBC: 4.9 x10E6/uL (ref 3.77–5.28)
RDW: 12 % (ref 11.7–15.4)
WBC: 10 10*3/uL (ref 3.4–10.8)

## 2023-09-16 LAB — HEMOGLOBIN A1C
Est. average glucose Bld gHb Est-mCnc: 94 mg/dL
Hgb A1c MFr Bld: 4.9 % (ref 4.8–5.6)

## 2023-09-16 LAB — VITAMIN D 25 HYDROXY (VIT D DEFICIENCY, FRACTURES): Vit D, 25-Hydroxy: 45.3 ng/mL (ref 30.0–100.0)

## 2023-09-16 NOTE — Telephone Encounter (Signed)
Please advice on lab results?  

## 2023-09-18 ENCOUNTER — Other Ambulatory Visit: Payer: Self-pay | Admitting: Family Medicine

## 2023-09-18 DIAGNOSIS — E038 Other specified hypothyroidism: Secondary | ICD-10-CM

## 2023-09-18 NOTE — Progress Notes (Signed)
Please inform the patient that her labs indicate subclinical hypothyroidism. I have placed orders for repeat thyroid levels in 6 weeks, which will be on October 29, 2023. Her cholesterol levels are elevated, and I recommend making lifestyle changes. This includes avoiding simple carbohydrates such as cakes, sweet desserts, ice cream, soda (diet or regular), sweet tea, candies, chips, cookies, store-bought juices, excessive alcohol (more than 1-2 drinks per day), lemonade, artificial sweeteners, donuts, coffee creamers, and sugar-free products. Additionally, I advise reducing the consumption of greasy, fatty foods and increasing physical activity.

## 2023-10-10 ENCOUNTER — Ambulatory Visit
Admission: RE | Admit: 2023-10-10 | Discharge: 2023-10-10 | Disposition: A | Discharge status: Home / Self Care | Payer: Medicare HMO | Source: Ambulatory Visit | Attending: Family Medicine | Admitting: Family Medicine

## 2023-10-10 DIAGNOSIS — Z1231 Encounter for screening mammogram for malignant neoplasm of breast: Secondary | ICD-10-CM

## 2023-10-15 DIAGNOSIS — G603 Idiopathic progressive neuropathy: Secondary | ICD-10-CM | POA: Diagnosis not present

## 2023-10-15 DIAGNOSIS — M5417 Radiculopathy, lumbosacral region: Secondary | ICD-10-CM | POA: Diagnosis not present

## 2023-10-15 DIAGNOSIS — M5412 Radiculopathy, cervical region: Secondary | ICD-10-CM | POA: Diagnosis not present

## 2023-11-24 ENCOUNTER — Other Ambulatory Visit: Payer: Self-pay | Admitting: Family Medicine

## 2023-12-17 NOTE — Telephone Encounter (Signed)
 Copied from CRM [id number]. Topic: Appointments - Appointment Cancel/Reschedule >> Dec 17, 2023  1:19 PM Dondra Prader E wrote: Patient/patient representative is calling to cancel or reschedule an appointment. Refer to attachments for appointment information.

## 2023-12-18 ENCOUNTER — Ambulatory Visit: Payer: Self-pay | Admitting: Family Medicine

## 2023-12-18 NOTE — Telephone Encounter (Signed)
 Copied from CRM [id number]. Topic: Appointments - Appointment Info/Confirmation >> Dec 18, 2023  9:16 AM Shon Hale wrote: Patient wanting to know what exactly the appointment is for today. Pt wanting to know if labs will be done and if she needs to fast.   Please assist pt further

## 2023-12-19 ENCOUNTER — Encounter: Payer: Self-pay | Admitting: Family Medicine

## 2023-12-19 ENCOUNTER — Ambulatory Visit: Payer: Medicare HMO | Admitting: Family Medicine

## 2023-12-19 ENCOUNTER — Ambulatory Visit (INDEPENDENT_AMBULATORY_CARE_PROVIDER_SITE_OTHER): Payer: Medicare HMO | Admitting: Family Medicine

## 2023-12-19 VITALS — BP 133/90 | HR 104 | Resp 16 | Ht 63.0 in | Wt 266.0 lb

## 2023-12-19 DIAGNOSIS — E038 Other specified hypothyroidism: Secondary | ICD-10-CM

## 2023-12-19 NOTE — Assessment & Plan Note (Signed)
 Pending thyroid levels The patient is encouraged to follow up if experiencing symptoms of hypothyroidism, despite compliance with the current treatment regimen, such as fatigue, weight gain, constipation, dry skin, cold intolerance, hair loss, or depression.

## 2023-12-19 NOTE — Progress Notes (Signed)
 Established Patient Office Visit  Subjective:  Patient ID: Linda Rollins, female    DOB: Dec 05, 1970  Age: 53 y.o. MRN: [id number]  CC:  Chief Complaint  Patient presents with   Follow-up    Check thyroid levels and med change if needed     HPI Linda Rollins is a 53 y.o. female with past medical history of Hypothyroidism presents for f/u. For the details of today's visit, please refer to the assessment and plan.     Past Medical History:  Diagnosis Date   Dizziness    Thyroid disease    Vertigo     Past Surgical History:  Procedure Laterality Date   cortisone shots      [facility] @ [facility]; Dr. Victorino Dike (feet) and Dr. Ranell Patrick (knees)   DENTAL SURGERY     wisdom teeth removal   LEEP      Family History  Problem Relation Age of Onset   Pulmonary embolism Mother    High blood pressure Other        father's side   Diabetes Other        mother's side   Heart disease Other        mother's side    Social History   Socioeconomic History   Marital status: Single    Spouse name: Not on file   Number of children: 1   Years of education: Not on file   Highest education level: 12th grade  Occupational History   Not on file  Tobacco Use   Smoking status: Never   Smokeless tobacco: Never  Vaping Use   Vaping status: Never Used  Substance and Sexual Activity   Alcohol use: Never   Drug use: Never   Sexual activity: Not on file  Other Topics Concern   Not on file  Social History Narrative   Lives at home with her son   Right handed   Caffeine: no   Social Drivers of Corporate investment banker Strain: Medium Risk (02/06/2023)   Overall Financial Resource Strain (CARDIA)    Difficulty of Paying Living Expenses: Somewhat hard  Food Insecurity: Food Insecurity Present (02/06/2023)   Hunger Vital Sign    Worried About Running Out of Food in the Last Year: Sometimes true    Ran Out of Food in the Last Year: Sometimes true  Transportation Needs: Unmet  Transportation Needs (02/06/2023)   PRAPARE - Administrator, Civil Service (Medical): Yes    Lack of Transportation (Non-Medical): No  Physical Activity: Inactive (02/06/2023)   Exercise Vital Sign    Days of Exercise per Week: 0 days    Minutes of Exercise per Session: 30 min  Stress: Stress Concern Present (02/06/2023)   [employer] of Occupational Health - Occupational Stress Questionnaire    Feeling of Stress : Rather much  Social Connections: Socially Isolated (02/06/2023)   Social Connection and Isolation Panel [NHANES]    Frequency of Communication with Friends and Family: Twice a week    Frequency of Social Gatherings with Friends and Family: Never    Attends Religious Services: Never    Database administrator or Organizations: No    Attends Banker Meetings: Never    Marital Status: Never married  Intimate Partner Violence: Not At Risk (01/08/2023)   Humiliation, Afraid, Rape, and Kick questionnaire    Fear of Current or Ex-Partner: No    Emotionally Abused: No    Physically  Abused: No    Sexually Abused: No    Outpatient Medications Prior to Visit  Medication Sig Dispense Refill   levothyroxine (SYNTHROID) 112 MCG tablet TAKE 1 TABLET BY MOUTH DAILY 30 tablet 2   gabapentin (NEURONTIN) 300 MG capsule Take 300 mg by mouth 3 (three) times daily. (Patient not taking: Reported on 12/19/2023)     ibuprofen (ADVIL,MOTRIN) 200 MG tablet Take 600 mg by mouth daily as needed for moderate pain.  (Patient not taking: Reported on 12/19/2023)     levothyroxine (SYNTHROID) 200 MCG tablet 200 mcg.     No facility-administered medications prior to visit.    No Known Allergies  ROS Review of Systems  Constitutional:  Negative for chills and fever.  Eyes:  Negative for visual disturbance.  Respiratory:  Negative for chest tightness and shortness of breath.   Neurological:  Negative for dizziness and headaches.      Objective:    Physical Exam HENT:      Head: Normocephalic.     Mouth/Throat:     Mouth: Mucous membranes are moist.  Cardiovascular:     Rate and Rhythm: Normal rate.     Heart sounds: Normal heart sounds.  Pulmonary:     Effort: Pulmonary effort is normal.     Breath sounds: Normal breath sounds.  Neurological:     Mental Status: She is alert.     BP (!) 133/90   Pulse (!) 104   Resp 16   Ht 5\' 3"  (1.6 m)   Wt 266 lb (120.7 kg)   LMP 06/29/2017   SpO2 93%   BMI 47.12 kg/m  Wt Readings from Last 3 Encounters:  12/19/23 266 lb (120.7 kg)  09/12/23 266 lb 1.3 oz (120.7 kg)  08/28/23 260 lb (117.9 kg)    Lab Results  Component Value Date   TSH 5.570 (H) 09/15/2023   Lab Results  Component Value Date   WBC 10.0 09/15/2023   HGB 14.4 09/15/2023   HCT 43.4 09/15/2023   MCV 89 09/15/2023   PLT 392 09/15/2023   Lab Results  Component Value Date   NA 140 09/15/2023   K 4.1 09/15/2023   CO2 20 09/15/2023   GLUCOSE 102 (H) 09/15/2023   BUN 13 09/15/2023   CREATININE 0.89 09/15/2023   BILITOT 0.5 09/15/2023   ALKPHOS 108 09/15/2023   AST 18 09/15/2023   ALT 17 09/15/2023   PROT 6.6 09/15/2023   ALBUMIN 4.4 09/15/2023   CALCIUM 10.1 09/15/2023   ANIONGAP 11 08/15/2023   EGFR 77 09/15/2023   Lab Results  Component Value Date   CHOL 273 (H) 09/15/2023   Lab Results  Component Value Date   HDL 44 09/15/2023   Lab Results  Component Value Date   LDLCALC 206 (H) 09/15/2023   Lab Results  Component Value Date   TRIG 124 09/15/2023   Lab Results  Component Value Date   CHOLHDL 6.2 (H) 09/15/2023   Lab Results  Component Value Date   HGBA1C 4.9 09/15/2023      Assessment & Plan:  Other specified hypothyroidism Assessment & Plan: Pending thyroid levels The patient is encouraged to follow up if experiencing symptoms of hypothyroidism, despite compliance with the current treatment regimen, such as fatigue, weight gain, constipation, dry skin, cold intolerance, hair loss, or  depression.     Note: This chart has been completed using Engineer, civil (consulting) software, and while attempts have been made to ensure accuracy, certain words and phrases  may not be transcribed as intended.   Follow-up: No follow-ups on file.   Gilmore Laroche, FNP

## 2023-12-19 NOTE — Patient Instructions (Signed)
 I appreciate the opportunity to provide care to you today!    Labs: please stop by the lab today to get your blood drawn (TSH)  Please continue to a heart-healthy diet and increase your physical activities. Try to exercise for at least five days a week.    It was a pleasure to see you and I look forward to continuing to work together on your health and well-being. Please do not hesitate to call the office if you need care or have questions about your care.  In case of emergency, please visit the Emergency Department for urgent care, or contact our clinic at [phone] to schedule an appointment. We're here to help you!   Have a wonderful day and week. With Gratitude, Gilmore Laroche MSN, FNP-BC

## 2023-12-31 ENCOUNTER — Ambulatory Visit: Payer: Medicare HMO | Admitting: Dermatology

## 2024-01-19 DIAGNOSIS — E038 Other specified hypothyroidism: Secondary | ICD-10-CM | POA: Diagnosis not present

## 2024-01-20 LAB — TSH+FREE T4
Free T4: 1.72 ng/dL (ref 0.82–1.77)
TSH: 5.84 u[IU]/mL — ABNORMAL HIGH (ref 0.450–4.500)

## 2024-01-24 ENCOUNTER — Encounter: Payer: Self-pay | Admitting: Family Medicine

## 2024-01-29 ENCOUNTER — Ambulatory Visit (INDEPENDENT_AMBULATORY_CARE_PROVIDER_SITE_OTHER): Payer: Medicare HMO | Admitting: Family Medicine

## 2024-01-29 ENCOUNTER — Encounter: Payer: Self-pay | Admitting: Family Medicine

## 2024-01-29 VITALS — BP 136/89 | HR 100 | Resp 16 | Ht 63.0 in | Wt 268.4 lb

## 2024-01-29 DIAGNOSIS — E038 Other specified hypothyroidism: Secondary | ICD-10-CM | POA: Diagnosis not present

## 2024-01-29 DIAGNOSIS — E7849 Other hyperlipidemia: Secondary | ICD-10-CM | POA: Diagnosis not present

## 2024-01-29 DIAGNOSIS — E785 Hyperlipidemia, unspecified: Secondary | ICD-10-CM

## 2024-01-29 DIAGNOSIS — R7301 Impaired fasting glucose: Secondary | ICD-10-CM

## 2024-01-29 DIAGNOSIS — E782 Mixed hyperlipidemia: Secondary | ICD-10-CM | POA: Insufficient documentation

## 2024-01-29 DIAGNOSIS — E559 Vitamin D deficiency, unspecified: Secondary | ICD-10-CM

## 2024-01-29 MED ORDER — LEVOTHYROXINE SODIUM 112 MCG PO TABS: 112.0000 ug | ORAL_TABLET | Freq: Every day | ORAL | 2 refills | Status: DC

## 2024-01-29 NOTE — Assessment & Plan Note (Signed)
 No complaints or concerns today The patient is encouraged to follow up if experiencing symptoms of hypothyroidism, despite compliance with the current treatment regimen, such as fatigue, weight gain, constipation, dry skin, cold intolerance, hair loss, or depression.

## 2024-01-29 NOTE — Assessment & Plan Note (Signed)
 The patient reports that she has been drinking goat milk in an effort to lower her cholesterol levels. I discussed lifestyle modifications, including avoiding simple carbohydrates such as cakes, sweet desserts, ice cream, soda (diet or regular), sweet tea, candies, chips, cookies, store-bought juices, excessive alcohol (more than 1-2 drinks per day), lemonade, artificial sweeteners, donuts, coffee creamers, and sugar-free products. Additionally, the patient was advised to reduce the consumption of greasy, fatty foods and to increase physical activity to support cardiovascular health. The patient verbalized understanding and is aware of the plan of care.

## 2024-01-29 NOTE — Patient Instructions (Addendum)
 I appreciate the opportunity to provide care to you today!    Follow up:  4 months  Labs: please stop by the lab today to get your blood drawn (CBC, CMP,  Lipid profile, HgA1c, Vit D)  Here are some foods to avoid or reduce in your diet to help manage cholesterol levels:  Fried Foods:Deep-fried items such as french fries, fried chicken, and fried snacks are high in unhealthy fats and can raise LDL (bad) cholesterol levels. Processed Meats:Foods like bacon, sausage, hot dogs, and deli meats are often high in saturated fat and cholesterol. Full-Fat Dairy Products:Whole milk, full-fat yogurt, butter, cream, and cheese are rich in saturated fats, which can increase cholesterol levels. Baked Goods and Sweets:Pastries, cakes, cookies, and donuts often contain trans fats and added sugars, which can raise LDL cholesterol and lower HDL (good) cholesterol. Red Meat:Beef, lamb, and pork are high in saturated fat. Lean cuts or plant-based protein alternatives are better options. Lard and Shortening:Used in some baked goods, lard and shortening are high in trans fats and should be avoided. Fast Food:Many fast food items are cooked with unhealthy oils and contain high amounts of saturated and trans fats. Processed Snacks:Chips, crackers, and certain microwave popcorns can contain trans fats and high levels of unhealthy oils. Shellfish:While nutritious in other ways, some shellfish like shrimp, lobster, and crab are high in cholesterol. They should be consumed in moderation. Coconut and Palm Oils:these oils are high in saturated fat and can raise cholesterol levels when used in cooking or baking.       Please continue to a heart-healthy diet and increase your physical activities. Try to exercise for at least five days a week.    It was a pleasure to see you and I look forward to continuing to work together on your health and well-being. Please do not hesitate to call the office if you need care or  have questions about your care.  In case of emergency, please visit the Emergency Department for urgent care, or contact our clinic at [phone] to schedule an appointment. We're here to help you!   Have a wonderful day and week. With Gratitude, Gilmore Laroche MSN, FNP-BC

## 2024-01-29 NOTE — Progress Notes (Signed)
 Established Patient Office Visit  Subjective:  Patient ID: Linda Rollins, female    DOB: 1971-04-22  Age: 53 y.o. MRN: [id number]  CC:  Chief Complaint  Patient presents with   Hypothyroidism    3 month follow up visit to review labs     HPI Linda Rollins is a 54 y.o. female with past medical history of Hypothyroidism, hyperlipidemia  presents for f/u of  chronic medical conditions. For the details of today's visit, please refer to the assessment and plan.     Past Medical History:  Diagnosis Date   Dizziness    Thyroid disease    Vertigo     Past Surgical History:  Procedure Laterality Date   cortisone shots      [facility]; Dr. Victorino Dike (feet) and Dr. Ranell Patrick (knees)   DENTAL SURGERY     wisdom teeth removal   LEEP      Family History  Problem Relation Age of Onset   Pulmonary embolism Mother    High blood pressure Other        father's side   Diabetes Other        mother's side   Heart disease Other        mother's side    Social History   Socioeconomic History   Marital status: Single    Spouse name: Not on file   Number of children: 1   Years of education: Not on file   Highest education level: 12th grade  Occupational History   Not on file  Tobacco Use   Smoking status: Never   Smokeless tobacco: Never  Vaping Use   Vaping status: Never Used  Substance and Sexual Activity   Alcohol use: Never   Drug use: Never   Sexual activity: Not on file  Other Topics Concern   Not on file  Social History Narrative   Lives at home with her son   Right handed   Caffeine: no   Social Drivers of Corporate investment banker Strain: Medium Risk (02/06/2023)   Overall Financial Resource Strain (CARDIA)    Difficulty of Paying Living Expenses: Somewhat hard  Food Insecurity: Food Insecurity Present (02/06/2023)   Hunger Vital Sign    Worried About Running Out of Food in the Last Year: Sometimes true    Ran Out of Food in the Last Year:  Sometimes true  Transportation Needs: Unmet Transportation Needs (02/06/2023)   PRAPARE - Administrator, Civil Service (Medical): Yes    Lack of Transportation (Non-Medical): No  Physical Activity: Unknown (02/06/2023)   Exercise Vital Sign    Days of Exercise per Week: 0 days    Minutes of Exercise per Session: Not on file  Recent Concern: Physical Activity - Inactive (02/06/2023)   Exercise Vital Sign    Days of Exercise per Week: 0 days    Minutes of Exercise per Session: 30 min  Stress: Stress Concern Present (02/06/2023)   [employer] of Occupational Health - Occupational Stress Questionnaire    Feeling of Stress : Rather much  Social Connections: Socially Isolated (02/06/2023)   Social Connection and Isolation Panel [NHANES]    Frequency of Communication with Friends and Family: Twice a week    Frequency of Social Gatherings with Friends and Family: Never    Attends Religious Services: Never    Database administrator or Organizations: No    Attends Banker Meetings: Not on  file    Marital Status: Never married  Intimate Partner Violence: Not At Risk (01/08/2023)   Humiliation, Afraid, Rape, and Kick questionnaire    Fear of Current or Ex-Partner: No    Emotionally Abused: No    Physically Abused: No    Sexually Abused: No    Outpatient Medications Prior to Visit  Medication Sig Dispense Refill   levothyroxine (SYNTHROID) 112 MCG tablet TAKE 1 TABLET BY MOUTH DAILY 30 tablet 2   gabapentin (NEURONTIN) 300 MG capsule Take 300 mg by mouth 3 (three) times daily. (Patient not taking: Reported on 01/29/2024)     ibuprofen (ADVIL,MOTRIN) 200 MG tablet Take 600 mg by mouth daily as needed for moderate pain.  (Patient not taking: Reported on 12/19/2023)     No facility-administered medications prior to visit.    No Known Allergies  ROS Review of Systems  Constitutional:  Negative for chills and fever.  Eyes:  Negative for visual disturbance.   Respiratory:  Negative for chest tightness and shortness of breath.   Neurological:  Negative for dizziness and headaches.      Objective:    Physical Exam HENT:     Head: Normocephalic.     Mouth/Throat:     Mouth: Mucous membranes are moist.  Cardiovascular:     Rate and Rhythm: Normal rate.     Heart sounds: Normal heart sounds.  Pulmonary:     Effort: Pulmonary effort is normal.     Breath sounds: Normal breath sounds.  Neurological:     Mental Status: She is alert.     BP 136/89   Pulse 100   Resp 16   Ht 5\' 3"  (1.6 m)   Wt 268 lb 6.4 oz (121.7 kg)   LMP 06/29/2017   SpO2 95%   BMI 47.54 kg/m  Wt Readings from Last 3 Encounters:  01/29/24 268 lb 6.4 oz (121.7 kg)  12/19/23 266 lb (120.7 kg)  09/12/23 266 lb 1.3 oz (120.7 kg)    Lab Results  Component Value Date   TSH 5.840 (H) 01/19/2024   Lab Results  Component Value Date   WBC 10.0 09/15/2023   HGB 14.4 09/15/2023   HCT 43.4 09/15/2023   MCV 89 09/15/2023   PLT 392 09/15/2023   Lab Results  Component Value Date   NA 140 09/15/2023   K 4.1 09/15/2023   CO2 20 09/15/2023   GLUCOSE 102 (H) 09/15/2023   BUN 13 09/15/2023   CREATININE 0.89 09/15/2023   BILITOT 0.5 09/15/2023   ALKPHOS 108 09/15/2023   AST 18 09/15/2023   ALT 17 09/15/2023   PROT 6.6 09/15/2023   ALBUMIN 4.4 09/15/2023   CALCIUM 10.1 09/15/2023   ANIONGAP 11 08/15/2023   EGFR 77 09/15/2023   Lab Results  Component Value Date   CHOL 273 (H) 09/15/2023   Lab Results  Component Value Date   HDL 44 09/15/2023   Lab Results  Component Value Date   LDLCALC 206 (H) 09/15/2023   Lab Results  Component Value Date   TRIG 124 09/15/2023   Lab Results  Component Value Date   CHOLHDL 6.2 (H) 09/15/2023   Lab Results  Component Value Date   HGBA1C 4.9 09/15/2023      Assessment & Plan:  Hyperlipidemia LDL goal <100 Assessment & Plan: The patient reports that she has been drinking goat milk in an effort to lower her  cholesterol levels. I discussed lifestyle modifications, including avoiding simple carbohydrates such as cakes,  sweet desserts, ice cream, soda (diet or regular), sweet tea, candies, chips, cookies, store-bought juices, excessive alcohol (more than 1-2 drinks per day), lemonade, artificial sweeteners, donuts, coffee creamers, and sugar-free products. Additionally, the patient was advised to reduce the consumption of greasy, fatty foods and to increase physical activity to support cardiovascular health. The patient verbalized understanding and is aware of the plan of care.    Other specified hypothyroidism Assessment & Plan: No complaints or concerns today The patient is encouraged to follow up if experiencing symptoms of hypothyroidism, despite compliance with the current treatment regimen, such as fatigue, weight gain, constipation, dry skin, cold intolerance, hair loss, or depression.   Orders: -     Levothyroxine Sodium; Take 1 tablet (112 mcg total) by mouth daily.  Dispense: 30 tablet; Refill: 2  IFG (impaired fasting glucose) -     Hemoglobin A1c  Vitamin D deficiency -     VITAMIN D 25 Hydroxy (Vit-D Deficiency, Fractures)  TSH (thyroid-stimulating hormone deficiency) -     TSH + free T4  Other hyperlipidemia -     CMP14+EGFR -     CBC with Differential/Platelet  Note: This chart has been completed using Engineer, civil (consulting) software, and while attempts have been made to ensure accuracy, certain words and phrases may not be transcribed as intended.    Follow-up: Return in about 4 months (around 05/30/2024).   Gilmore Laroche, FNP

## 2024-01-30 LAB — CMP14+EGFR
ALT: 11 IU/L (ref 0–32)
AST: 17 IU/L (ref 0–40)
Albumin: 4.5 g/dL (ref 3.8–4.9)
Alkaline Phosphatase: 108 IU/L (ref 44–121)
BUN/Creatinine Ratio: 15 (ref 9–23)
BUN: 14 mg/dL (ref 6–24)
Bilirubin Total: 0.6 mg/dL (ref 0.0–1.2)
CO2: 22 mmol/L (ref 20–29)
Calcium: 9.6 mg/dL (ref 8.7–10.2)
Chloride: 103 mmol/L (ref 96–106)
Creatinine, Ser: 0.95 mg/dL (ref 0.57–1.00)
Globulin, Total: 1.8 g/dL (ref 1.5–4.5)
Glucose: 89 mg/dL (ref 70–99)
Potassium: 4.4 mmol/L (ref 3.5–5.2)
Sodium: 139 mmol/L (ref 134–144)
Total Protein: 6.3 g/dL (ref 6.0–8.5)
eGFR: 71 mL/min/{1.73_m2} (ref >59)

## 2024-01-30 LAB — HEMOGLOBIN A1C
Est. average glucose Bld gHb Est-mCnc: 97 mg/dL
Hgb A1c MFr Bld: 5 % (ref 4.8–5.6)

## 2024-01-30 LAB — VITAMIN D 25 HYDROXY (VIT D DEFICIENCY, FRACTURES): Vit D, 25-Hydroxy: 37.2 ng/mL (ref 30.0–100.0)

## 2024-01-30 LAB — CBC WITH DIFFERENTIAL/PLATELET
Basophils Absolute: 0 10*3/uL (ref 0.0–0.2)
Basos: 0 %
EOS (ABSOLUTE): 0.1 10*3/uL (ref 0.0–0.4)
Eos: 1 %
Hematocrit: 42 % (ref 34.0–46.6)
Hemoglobin: 13.9 g/dL (ref 11.1–15.9)
Immature Grans (Abs): 0 10*3/uL (ref 0.0–0.1)
Immature Granulocytes: 0 %
Lymphocytes Absolute: 2.6 10*3/uL (ref 0.7–3.1)
Lymphs: 24 %
MCH: 28.8 pg (ref 26.6–33.0)
MCHC: 33.1 g/dL (ref 31.5–35.7)
MCV: 87 fL (ref 79–97)
Monocytes Absolute: 0.6 10*3/uL (ref 0.1–0.9)
Monocytes: 6 %
Neutrophils Absolute: 7.4 10*3/uL — ABNORMAL HIGH (ref 1.4–7.0)
Neutrophils: 69 %
Platelets: 444 10*3/uL (ref 150–450)
RBC: 4.82 x10E6/uL (ref 3.77–5.28)
RDW: 11.8 % (ref 11.7–15.4)
WBC: 10.7 10*3/uL (ref 3.4–10.8)

## 2024-01-30 LAB — TSH+FREE T4
Free T4: 1.54 ng/dL (ref 0.82–1.77)
TSH: 5.33 u[IU]/mL — ABNORMAL HIGH (ref 0.450–4.500)

## 2024-02-01 ENCOUNTER — Encounter: Payer: Self-pay | Admitting: Family Medicine

## 2024-02-01 ENCOUNTER — Other Ambulatory Visit: Payer: Self-pay | Admitting: Family Medicine

## 2024-02-01 DIAGNOSIS — E038 Other specified hypothyroidism: Secondary | ICD-10-CM

## 2024-02-01 MED ORDER — LEVOTHYROXINE SODIUM 125 MCG PO TABS: 125.0000 ug | ORAL_TABLET | Freq: Every day | ORAL | 3 refills | Status: DC

## 2024-02-02 ENCOUNTER — Ambulatory Visit: Payer: Medicare HMO

## 2024-02-13 ENCOUNTER — Telehealth: Payer: Self-pay

## 2024-02-13 NOTE — Telephone Encounter (Signed)
 Spoke with patient. She is aware that appointment for her AWV will not be rescheduled. Verbalizes understanding.

## 2024-02-13 NOTE — Telephone Encounter (Signed)
 Copied from CRM [id number]. Topic: Appointments - Scheduling Inquiry for Clinic >> Feb 12, 2024  2:41 PM Rosaria E wrote: Reason for CRM: Pt called and wants to know if she needs to reschedule her upcoming MWV? It is marked for rescheduling but she says this is not accurate because she has already rescheduled.  Best contact: [phone]

## 2024-03-08 DIAGNOSIS — E038 Other specified hypothyroidism: Secondary | ICD-10-CM | POA: Diagnosis not present

## 2024-03-10 LAB — TSH+FREE T4
Free T4: 1.91 ng/dL — ABNORMAL HIGH (ref 0.82–1.77)
TSH: 1.63 u[IU]/mL (ref 0.450–4.500)

## 2024-03-12 ENCOUNTER — Telehealth: Payer: Self-pay

## 2024-03-12 NOTE — Telephone Encounter (Signed)
 PCP has not interpreted labs yet

## 2024-03-12 NOTE — Telephone Encounter (Signed)
 Copied from CRM [id number]. Topic: Clinical - Lab/Test Results >> Mar 12, 2024 12:20 PM Everette C wrote: Reason for CRM: The patient would like to be contacted by a member of clinical staff when possible to discuss their most recent thyroid  labs from 03/08/24  Please contact the patient further when possible

## 2024-03-17 ENCOUNTER — Telehealth: Payer: Self-pay

## 2024-03-17 NOTE — Telephone Encounter (Signed)
 Copied from CRM [id number]. Topic: Clinical - Lab/Test Results >> Mar 17, 2024 11:56 AM Baldomero Bone wrote: Reason for CRM: Patient wants to discuss lab results regarding thyroid  level. Patient wants to know if she should keep taking the levothyroxine  (SYNTHROID ) 125 MCG tablet. Callback number is [phone].

## 2024-03-18 ENCOUNTER — Ambulatory Visit: Payer: Self-pay | Admitting: Family Medicine

## 2024-03-30 ENCOUNTER — Ambulatory Visit

## 2024-03-30 VITALS — Ht 63.0 in | Wt 268.0 lb

## 2024-03-30 DIAGNOSIS — Z0000 Encounter for general adult medical examination without abnormal findings: Secondary | ICD-10-CM | POA: Diagnosis not present

## 2024-03-30 NOTE — Patient Instructions (Signed)
 Linda Rollins , Thank you for taking time out of your busy schedule to complete your Annual Wellness Visit with me. I enjoyed our conversation and look forward to speaking with you again next year. I, as well as your care team,  appreciate your ongoing commitment to your health goals. Please review the following plan we discussed and let me know if I can assist you in the future. Your Game plan/ To Do List    Follow up Visits: Next Medicare AWV with our clinical staff: In 1 year    Have you seen your provider in the last 6 months (3 months if uncontrolled diabetes)? Yes Next Office Visit with your provider: 05/31/24 @ 9:40  Clinician Recommendations:  Aim for 30 minutes of exercise or brisk walking, 6-8 glasses of water, and 5 servings of fruits and vegetables each day.       This is a list of the screening recommended for you and due dates:  Health Maintenance  Topic Date Due   COVID-19 Vaccine (1) Never done   Zoster (Shingles) Vaccine (1 of 2) Never done   Stool Blood Test  08/16/2023   Flu Shot  05/28/2024   Mammogram  10/09/2024   Medicare Annual Wellness Visit  03/30/2025   Cologuard (Stool DNA test)  08/15/2025   Pap with HPV screening  02/09/2026   DTaP/Tdap/Td vaccine (3 - Td or Tdap) 12/11/2026   Hepatitis C Screening  Completed   HIV Screening  Completed   HPV Vaccine  Aged Out   Meningitis B Vaccine  Aged Out    Advanced directives: (ACP Link)Information on Advanced Care Planning can be found at [place]  Print production planner Health Care Directives Advance Health Care Directives. [email or web address]   Advance Care Planning is important because it:  [x]  Makes sure you receive the medical care that is consistent with your values, goals, and preferences  [x]  It provides guidance to your family and loved ones and reduces their decisional burden about whether or not they are making the right decisions based on your wishes.  Follow the link provided in your after visit summary  or read over the paperwork we have mailed to you to help you started getting your Advance Directives in place. If you need assistance in completing these, please reach out to us  so that we can help you!  See attachments for Preventive Care and Fall Prevention Tips.

## 2024-03-30 NOTE — Progress Notes (Addendum)
 Subjective:   Linda Rollins is a 53 y.o. who presents for a Medicare Wellness preventive visit.  As a reminder, Annual Wellness Visits don't include a physical exam, and some assessments may be limited, especially if this visit is performed virtually. We may recommend an in-person follow-up visit with your provider if needed.  Visit Complete: Virtual I connected with  Linda Rollins on 03/30/24 by a audio enabled telemedicine application and verified that I am speaking with the correct person using two identifiers.  Patient Location: Home  Provider Location: Home Office  I discussed the limitations of evaluation and management by telemedicine. The patient expressed understanding and agreed to proceed.  Vital Signs: Because this visit was a virtual/telehealth visit, some criteria may be missing or patient reported. Any vitals not documented were not able to be obtained and vitals that have been documented are patient reported.  VideoDeclined- This patient declined Librarian, academic. Therefore the visit was completed with audio only.  Persons Participating in Visit: Patient.  AWV Questionnaire: No: Patient Medicare AWV questionnaire was not completed prior to this visit.  Cardiac Risk Factors include: sedentary lifestyle     Objective:     Today's Vitals   03/30/24 1050  Weight: 268 lb (121.6 kg)  Height: 5\' 3"  (1.6 m)   Body mass index is 47.47 kg/m.     03/30/2024   10:53 AM 08/15/2023    5:25 PM 03/04/2019    2:10 PM 06/29/2017    3:07 PM 04/03/2015    7:27 AM  Advanced Directives  Does Patient Have a Medical Advance Directive? No No No No No  Would patient like information on creating a medical advance directive? Yes (MAU/Ambulatory/Procedural Areas - Information given)  No - Patient declined No - Patient declined No - patient declined information    Current Medications (verified) Outpatient Encounter Medications as of 03/30/2024   Medication Sig   levothyroxine  (SYNTHROID ) 125 MCG tablet Take 1 tablet (125 mcg total) by mouth daily.   gabapentin (NEURONTIN) 300 MG capsule Take 300 mg by mouth 3 (three) times daily. (Patient not taking: Reported on 12/19/2023)   ibuprofen (ADVIL,MOTRIN) 200 MG tablet Take 600 mg by mouth daily as needed for moderate pain.  (Patient not taking: Reported on 12/19/2023)   No facility-administered encounter medications on file as of 03/30/2024.    Allergies (verified) Patient has no known allergies.   History: Past Medical History:  Diagnosis Date   Dizziness    Thyroid  disease    Vertigo    Past Surgical History:  Procedure Laterality Date   cortisone shots      [facility] @ [facility]; Dr. Rosebud Confer (feet) and Dr. Brunilda Capra (knees)   DENTAL SURGERY     wisdom teeth removal   LEEP     Family History  Problem Relation Age of Onset   Pulmonary embolism Mother    High blood pressure Other        father's side   Diabetes Other        mother's side   Heart disease Other        mother's side   Social History   Socioeconomic History   Marital status: Single    Spouse name: Not on file   Number of children: 1   Years of education: Not on file   Highest education level: 12th grade  Occupational History   Not on file  Tobacco Use   Smoking status: Never  Smokeless tobacco: Never  Vaping Use   Vaping status: Never Used  Substance and Sexual Activity   Alcohol use: Never   Drug use: Never   Sexual activity: Not on file  Other Topics Concern   Not on file  Social History Narrative   Lives at home with her son   Right handed   Caffeine: no   Social Drivers of Corporate investment banker Strain: Low Risk  (03/30/2024)   Overall Financial Resource Strain (CARDIA)    Difficulty of Paying Living Expenses: Not very hard  Food Insecurity: No Food Insecurity (03/30/2024)   Hunger Vital Sign    Worried About Running Out of Food in the Last Year: Never true    Ran Out of  Food in the Last Year: Never true  Transportation Needs: No Transportation Needs (03/30/2024)   PRAPARE - Administrator, Civil Service (Medical): No    Lack of Transportation (Non-Medical): No  Physical Activity: Inactive (03/30/2024)   Exercise Vital Sign    Days of Exercise per Week: 0 days    Minutes of Exercise per Session: 0 min  Stress: No Stress Concern Present (03/30/2024)   [employer] of Occupational Health - Occupational Stress Questionnaire    Feeling of Stress : Only a little  Social Connections: Socially Isolated (03/30/2024)   Social Connection and Isolation Panel [NHANES]    Frequency of Communication with Friends and Family: Twice a week    Frequency of Social Gatherings with Friends and Family: Never    Attends Religious Services: Never    Database administrator or Organizations: No    Attends Engineer, structural: Never    Marital Status: Never married    Tobacco Counseling Counseling given: Not Answered    Clinical Intake:  Pre-visit preparation completed: Yes  Pain : No/denies pain     Diabetes: No  Lab Results  Component Value Date   HGBA1C 5.0 01/29/2024   HGBA1C 4.9 09/15/2023   HGBA1C 4.9 05/13/2023     How often do you need to have someone help you when you read instructions, pamphlets, or other written materials from your doctor or pharmacy?: 1 - Never  Interpreter Needed?: No  Information entered by :: Seabron Cypress LPN   Activities of Daily Living     03/30/2024   10:53 AM  In your present state of health, do you have any difficulty performing the following activities:  Hearing? 0  Vision? 0  Difficulty concentrating or making decisions? 0  Walking or climbing stairs? 1  Dressing or bathing? 0  Doing errands, shopping? 0  Preparing Food and eating ? N  Using the Toilet? N  In the past six months, have you accidently leaked urine? N  Do you have problems with loss of bowel control? N  Managing your  Medications? N  Managing your Finances? N  Housekeeping or managing your Housekeeping? N    Patient Care Team: Zarwolo, Gloria, FNP as PCP - General (Family Medicine) [facility]  I have updated your Care Teams any recent Medical Services you may have received from other providers in the past year.     Assessment:    This is a routine wellness examination for [place]  Hearing/Vision screen Hearing Screening - Comments:: Denies hearing difficulties   Vision Screening - Comments::  up to date with routine eye exams with [facility]se [facility]     Goals Addressed  This Visit's Progress    DIET - REDUCE CALORIE INTAKE   On track    Pt would like to lose weight, engage more in exercise.        Depression Screen     03/30/2024   10:57 AM 01/29/2024    8:16 AM 12/19/2023   11:11 AM 09/12/2023   10:36 AM 05/12/2023   10:25 AM 05/12/2023   10:14 AM 03/05/2023    8:43 AM  PHQ 2/9 Scores  PHQ - 2 Score 2 2 4 4 2  0 4  PHQ- 9 Score 7 8 18 18 9  0 11    Fall Risk     03/30/2024   10:53 AM 01/29/2024    8:16 AM 12/19/2023   11:11 AM 05/12/2023   10:25 AM 05/12/2023   10:14 AM  Fall Risk   Falls in the past year? 0 0 1 0 0  Number falls in past yr: 0 0 1 0 0  Injury with Fall? 0 0 0 0 0  Risk for fall due to : No Fall Risks   No Fall Risks No Fall Risks  Follow up Falls prevention discussed;Education provided;Falls evaluation completed Falls evaluation completed Falls evaluation completed Falls evaluation completed Falls evaluation completed    MEDICARE RISK AT HOME:  Medicare Risk at Home Any stairs in or around the home?: No If so, are there any without handrails?: No Home free of loose throw rugs in walkways, pet beds, electrical cords, etc?: Yes Adequate lighting in your home to reduce risk of falls?: Yes Life alert?: No Use of a cane, walker or w/c?: No Grab bars in the bathroom?: Yes Shower chair or bench in shower?: No Elevated  toilet seat or a handicapped toilet?: Yes  TIMED UP AND GO:  Was the test performed?  No  Cognitive Function: 6CIT completed        03/30/2024   10:53 AM 01/08/2023   10:35 AM  6CIT Screen  What Year? 0 points 0 points  What month? 0 points 0 points  What time? 0 points 0 points  Count back from 20 0 points 0 points  Months in reverse 0 points 0 points  Repeat phrase 0 points 0 points  Total Score 0 points 0 points    Immunizations Immunization History  Administered Date(s) Administered   Tdap 10/29/2005, 12/11/2016    Screening Tests Health Maintenance  Topic Date Due   COVID-19 Vaccine (1) Never done   Zoster Vaccines- Shingrix (1 of 2) Never done   COLON CANCER SCREENING ANNUAL FOBT  08/16/2023   INFLUENZA VACCINE  05/28/2024   MAMMOGRAM  10/09/2024   Medicare Annual Wellness (AWV)  03/30/2025   Fecal DNA (Cologuard)  08/15/2025   Cervical Cancer Screening (HPV/Pap Cotest)  02/09/2026   DTaP/Tdap/Td (3 - Td or Tdap) 12/11/2026   Hepatitis C Screening  Completed   HIV Screening  Completed   HPV VACCINES  Aged Out   Meningococcal B Vaccine  Aged Out    Health Maintenance  Health Maintenance Due  Topic Date Due   COVID-19 Vaccine (1) Never done   Zoster Vaccines- Shingrix (1 of 2) Never done   COLON CANCER SCREENING ANNUAL FOBT  08/16/2023   Health Maintenance Items Addressed: Information provided on Shingrix   Additional Screening:  Vision Screening: Recommended annual ophthalmology exams for early detection of glaucoma and other disorders of the eye. Would you like a referral to an eye doctor? No    Dental Screening: Recommended  annual dental exams for proper oral hygiene  Community Resource Referral / Chronic Care Management: CRR required this visit?  No   CCM required this visit?  No   Plan:    I have personally reviewed and noted the following in the patient's chart:   Medical and social history Use of alcohol, tobacco or illicit drugs   Current medications and supplements including opioid prescriptions. Patient is not currently taking opioid prescriptions. Functional ability and status Nutritional status Physical activity Advanced directives List of other physicians Hospitalizations, surgeries, and ER visits in previous 12 months Vitals Screenings to include cognitive, depression, and falls Referrals and appointments  In addition, I have reviewed and discussed with patient certain preventive protocols, quality metrics, and best practice recommendations. A written personalized care plan for preventive services as well as general preventive health recommendations were provided to patient.   Seabron Cypress [place], [place]   05/05/2955   After Visit Summary: (MyChart) Due to this being a telephonic visit, the after visit summary with patients personalized plan was offered to patient via MyChart   Notes: Please refer to Routing Comments.

## 2024-04-15 DIAGNOSIS — R42 Dizziness and giddiness: Secondary | ICD-10-CM | POA: Diagnosis not present

## 2024-04-15 DIAGNOSIS — R82998 Other abnormal findings in urine: Secondary | ICD-10-CM | POA: Diagnosis not present

## 2024-04-15 DIAGNOSIS — Z79890 Hormone replacement therapy (postmenopausal): Secondary | ICD-10-CM | POA: Diagnosis not present

## 2024-04-15 DIAGNOSIS — E079 Disorder of thyroid, unspecified: Secondary | ICD-10-CM | POA: Diagnosis not present

## 2024-04-15 DIAGNOSIS — R110 Nausea: Secondary | ICD-10-CM | POA: Diagnosis not present

## 2024-04-15 DIAGNOSIS — H93299 Other abnormal auditory perceptions, unspecified ear: Secondary | ICD-10-CM | POA: Diagnosis not present

## 2024-04-27 DIAGNOSIS — M7918 Myalgia, other site: Secondary | ICD-10-CM | POA: Diagnosis not present

## 2024-04-27 DIAGNOSIS — Z760 Encounter for issue of repeat prescription: Secondary | ICD-10-CM | POA: Diagnosis not present

## 2024-04-27 DIAGNOSIS — M7912 Myalgia of auxiliary muscles, head and neck: Secondary | ICD-10-CM | POA: Diagnosis not present

## 2024-04-27 DIAGNOSIS — G603 Idiopathic progressive neuropathy: Secondary | ICD-10-CM | POA: Diagnosis not present

## 2024-04-27 DIAGNOSIS — Z79899 Other long term (current) drug therapy: Secondary | ICD-10-CM | POA: Diagnosis not present

## 2024-05-20 DIAGNOSIS — L918 Other hypertrophic disorders of the skin: Secondary | ICD-10-CM | POA: Diagnosis not present

## 2024-05-20 DIAGNOSIS — D1801 Hemangioma of skin and subcutaneous tissue: Secondary | ICD-10-CM | POA: Diagnosis not present

## 2024-05-20 DIAGNOSIS — R208 Other disturbances of skin sensation: Secondary | ICD-10-CM | POA: Diagnosis not present

## 2024-05-20 DIAGNOSIS — L538 Other specified erythematous conditions: Secondary | ICD-10-CM | POA: Diagnosis not present

## 2024-05-20 DIAGNOSIS — D485 Neoplasm of uncertain behavior of skin: Secondary | ICD-10-CM | POA: Diagnosis not present

## 2024-05-31 ENCOUNTER — Ambulatory Visit (INDEPENDENT_AMBULATORY_CARE_PROVIDER_SITE_OTHER): Admitting: Family Medicine

## 2024-05-31 ENCOUNTER — Encounter: Payer: Self-pay | Admitting: Family Medicine

## 2024-05-31 VITALS — BP 121/88 | HR 102 | Resp 16 | Ht 63.0 in | Wt 272.0 lb

## 2024-05-31 DIAGNOSIS — E063 Autoimmune thyroiditis: Secondary | ICD-10-CM

## 2024-05-31 NOTE — Patient Instructions (Signed)
 I appreciate the opportunity to provide care to you today!    Follow up:  2 months  For a Healthier YOU, I Recommend: Reducing your intake of sugar, sodium, carbohydrates, and saturated fats. Increasing your fiber intake by incorporating more whole grains, fruits, and vegetables into your meals. Setting healthy goals with a focus on lowering your consumption of carbs, sugar, and unhealthy fats. Adding variety to your diet by including a wide range of fruits and vegetables. Cutting back on soda and limiting processed foods as much as possible. Staying active: In addition to taking your weight loss medication, aim for at least 150 minutes of moderate-intensity physical activity each week for optimal results.  Please follow up if your symptoms worsen or fail to improve.    Referrals today-  endocrinology     Please continue to a heart-healthy diet and increase your physical activities. Try to exercise for at least five days a week.    It was a pleasure to see you and I look forward to continuing to work together on your health and well-being. Please do not hesitate to call the office if you need care or have questions about your care.  In case of emergency, please visit the Emergency Department for urgent care, or contact our clinic at [phone] to schedule an appointment. We're here to help you!   Have a wonderful day and week. With Gratitude, Cassandria Drew MSN, FNP-BC

## 2024-05-31 NOTE — Assessment & Plan Note (Signed)
 A referral has been placed to Endocrinology for further evaluation and management. The patient prefers to wait until she sees the specialist before repeating her thyroid  labs, and this plan was agreed upon during the visit.

## 2024-05-31 NOTE — Progress Notes (Signed)
 Established Patient Office Visit  Subjective:  Patient ID: Linda Rollins, female    DOB: [birth year]  Age: 53 y.o. MRN: [id number]  CC:  Chief Complaint  Patient presents with   Medical Management of Chronic Issues    4 month follow up. Wants medication specifically for her T3     HPI Linda Rollins is a 53 y.o. 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  The patient presents with ongoing symptoms related to hypothyroidism secondary to Hashimoto's thyroiditis. She reports feeling sluggish and mentally foggy, which she attributes to her T4 not converting adequately to T3. Despite consistent efforts, she states she is unable to lose weight, regardless of diet or activity level. These symptoms have been present for over a year without significant improvement. She notes that while her thyroid  lab results are often within normal limits, she continues to feel unwell, describing it as "feeling like crap." She is seeking further evaluation and possible adjustment to her thyroid  management regimen.  Past Medical History:  Diagnosis Date   Dizziness    Thyroid  disease    Vertigo     Past Surgical History:  Procedure Laterality Date   cortisone shots      feet [facility]; Dr. Kit (feet) and Dr. Kay (knees)   DENTAL SURGERY     wisdom teeth removal   LEEP      Family History  Problem Relation Age of Onset   Pulmonary embolism Mother    High blood pressure Other        father's side   Diabetes Other        mother's side   Heart disease Other        mother's side    Social History   Socioeconomic History   Marital status: Single    Spouse name: Not on file   Number of children: 1   Years of education: Not on file   Highest education level: 12th grade  Occupational History   Not on file  Tobacco Use   Smoking status: Never   Smokeless tobacco: Never  Vaping Use    Vaping status: Never Used  Substance and Sexual Activity   Alcohol use: Never   Drug use: Never   Sexual activity: Not on file  Other Topics Concern   Not on file  Social History Narrative   Lives at home with her son   Right handed   Caffeine: no   Social Drivers of Corporate investment banker Strain: Low Risk  (03/30/2024)   Overall Financial Resource Strain (CARDIA)    Difficulty of Paying Living Expenses: Not very hard  Food Insecurity: No Food Insecurity (03/30/2024)   Hunger Vital Sign    Worried About Running Out of Food in the Last Year: Never true    Ran Out of Food in the Last Year: Never true  Transportation Needs: No Transportation Needs (03/30/2024)   PRAPARE - Administrator, Civil Service (Medical): No    Lack of Transportation (Non-Medical): No  Physical Activity: Inactive (03/30/2024)   Exercise Vital Sign    Days of Exercise per Week: 0 days    Minutes of Exercise per Session: 0 min  Stress: No Stress Concern Present (03/30/2024)   [employer] of Occupational Health - Occupational Stress Questionnaire    Feeling of Stress : Only a little  Social Connections: Socially Isolated (03/30/2024)  Social Advertising account executive    Frequency of Communication with Friends and Family: Twice a week    Frequency of Social Gatherings with Friends and Family: Never    Attends Religious Services: Never    Database administrator or Organizations: No    Attends Banker Meetings: Never    Marital Status: Never married  Intimate Partner Violence: Not At Risk (03/30/2024)   Humiliation, Afraid, Rape, and Kick questionnaire    Fear of Current or Ex-Partner: No    Emotionally Abused: No    Physically Abused: No    Sexually Abused: No    Outpatient Medications Prior to Visit  Medication Sig Dispense Refill   levothyroxine  (SYNTHROID ) 125 MCG tablet Take 1 tablet (125 mcg total) by mouth daily. 90 tablet 3   gabapentin (NEURONTIN) 300 MG capsule  Take 300 mg by mouth 3 (three) times daily. (Patient not taking: Reported on 05/31/2024)     ibuprofen (ADVIL,MOTRIN) 200 MG tablet Take 600 mg by mouth daily as needed for moderate pain.  (Patient not taking: Reported on 05/31/2024)     No facility-administered medications prior to visit.    No Known Allergies  ROS Review of Systems  Constitutional:  Negative for chills and fever.  Eyes:  Negative for visual disturbance.  Respiratory:  Negative for chest tightness and shortness of breath.   Neurological:  Negative for dizziness and headaches.      Objective:    Physical Exam HENT:     Head: Normocephalic.     Mouth/Throat:     Mouth: Mucous membranes are moist.  Cardiovascular:     Rate and Rhythm: Normal rate.     Heart sounds: Normal heart sounds.  Pulmonary:     Effort: Pulmonary effort is normal.     Breath sounds: Normal breath sounds.  Neurological:     Mental Status: She is alert.     BP 121/88   Pulse (!) 102   Resp 16   Ht 5' 3 (1.6 m)   Wt 272 lb (123.4 kg)   LMP 06/29/2017   SpO2 93%   BMI 48.18 kg/m  Wt Readings from Last 3 Encounters:  05/31/24 272 lb (123.4 kg)  03/30/24 268 lb (121.6 kg)  01/29/24 268 lb 6.4 oz (121.7 kg)    Lab Results  Component Value Date   TSH 1.630 03/08/2024   Lab Results  Component Value Date   WBC 10.7 01/29/2024   HGB 13.9 01/29/2024   HCT 42.0 01/29/2024   MCV 87 01/29/2024   PLT 444 01/29/2024   Lab Results  Component Value Date   NA 139 01/29/2024   K 4.4 01/29/2024   CO2 22 01/29/2024   GLUCOSE 89 01/29/2024   BUN 14 01/29/2024   CREATININE 0.95 01/29/2024   BILITOT 0.6 01/29/2024   ALKPHOS 108 01/29/2024   AST 17 01/29/2024   ALT 11 01/29/2024   PROT 6.3 01/29/2024   ALBUMIN 4.5 01/29/2024   CALCIUM 9.6 01/29/2024   ANIONGAP 11 08/15/2023   EGFR 71 01/29/2024   Lab Results  Component Value Date   CHOL 273 (H) 09/15/2023   Lab Results  Component Value Date   HDL 44 09/15/2023   Lab  Results  Component Value Date   LDLCALC 206 (H) 09/15/2023   Lab Results  Component Value Date   TRIG 124 09/15/2023   Lab Results  Component Value Date   CHOLHDL 6.2 (H) 09/15/2023   Lab Results  Component Value Date   HGBA1C 5.0 01/29/2024      Assessment & Plan:  Hypothyroidism due to Hashimoto's thyroiditis Assessment & Plan: A referral has been placed to Endocrinology for further evaluation and management. The patient prefers to wait until she sees the specialist before repeating her thyroid  labs, and this plan was agreed upon during the visit.   Orders: -     Ambulatory referral to Endocrinology   Note: This chart has been completed using Engineer, civil (consulting) software, and while attempts have been made to ensure accuracy, certain words and phrases may not be transcribed as intended.   Follow-up: Return in about 2 months (around 07/31/2024).   Maryfer Tauzin, FNP

## 2024-06-03 DIAGNOSIS — H52223 Regular astigmatism, bilateral: Secondary | ICD-10-CM | POA: Diagnosis not present

## 2024-06-03 DIAGNOSIS — H5213 Myopia, bilateral: Secondary | ICD-10-CM | POA: Diagnosis not present

## 2024-06-03 DIAGNOSIS — H524 Presbyopia: Secondary | ICD-10-CM | POA: Diagnosis not present

## 2024-06-14 ENCOUNTER — Telehealth: Payer: Self-pay

## 2024-06-14 NOTE — Telephone Encounter (Signed)
 Copied from CRM #[id number]. Topic: Referral - Status >> Jun 14, 2024 11:45 AM Harlene ORN wrote: Reason for CRM: Patient called to follow up on a referral for an endocrinologist. Has not recevied a call back yet to schedule in three weeks.  Please advise.

## 2024-06-14 NOTE — Telephone Encounter (Signed)
 Spoke to patient

## 2024-07-06 DIAGNOSIS — Z79899 Other long term (current) drug therapy: Secondary | ICD-10-CM | POA: Diagnosis not present

## 2024-07-06 DIAGNOSIS — Z760 Encounter for issue of repeat prescription: Secondary | ICD-10-CM | POA: Diagnosis not present

## 2024-07-06 DIAGNOSIS — E531 Pyridoxine deficiency: Secondary | ICD-10-CM | POA: Diagnosis not present

## 2024-07-06 DIAGNOSIS — E559 Vitamin D deficiency, unspecified: Secondary | ICD-10-CM | POA: Diagnosis not present

## 2024-07-06 DIAGNOSIS — M7912 Myalgia of auxiliary muscles, head and neck: Secondary | ICD-10-CM | POA: Diagnosis not present

## 2024-07-06 DIAGNOSIS — G603 Idiopathic progressive neuropathy: Secondary | ICD-10-CM | POA: Diagnosis not present

## 2024-07-06 DIAGNOSIS — M7918 Myalgia, other site: Secondary | ICD-10-CM | POA: Diagnosis not present

## 2024-07-06 DIAGNOSIS — E538 Deficiency of other specified B group vitamins: Secondary | ICD-10-CM | POA: Diagnosis not present

## 2024-07-06 DIAGNOSIS — G589 Mononeuropathy, unspecified: Secondary | ICD-10-CM | POA: Diagnosis not present

## 2024-07-06 DIAGNOSIS — R55 Syncope and collapse: Secondary | ICD-10-CM | POA: Diagnosis not present

## 2024-07-06 DIAGNOSIS — M2550 Pain in unspecified joint: Secondary | ICD-10-CM | POA: Diagnosis not present

## 2024-07-08 ENCOUNTER — Ambulatory Visit (INDEPENDENT_AMBULATORY_CARE_PROVIDER_SITE_OTHER): Admitting: "Endocrinology

## 2024-07-08 ENCOUNTER — Encounter: Payer: Self-pay | Admitting: "Endocrinology

## 2024-07-08 VITALS — BP 130/106 | HR 64 | Ht 63.0 in | Wt 276.8 lb

## 2024-07-08 DIAGNOSIS — E049 Nontoxic goiter, unspecified: Secondary | ICD-10-CM | POA: Diagnosis not present

## 2024-07-08 DIAGNOSIS — L83 Acanthosis nigricans: Secondary | ICD-10-CM | POA: Insufficient documentation

## 2024-07-08 DIAGNOSIS — I10 Essential (primary) hypertension: Secondary | ICD-10-CM | POA: Diagnosis not present

## 2024-07-08 DIAGNOSIS — E782 Mixed hyperlipidemia: Secondary | ICD-10-CM | POA: Diagnosis not present

## 2024-07-08 DIAGNOSIS — E039 Hypothyroidism, unspecified: Secondary | ICD-10-CM

## 2024-07-08 MED ORDER — ROSUVASTATIN CALCIUM 20 MG PO TABS: 20.0000 mg | ORAL_TABLET | Freq: Every day | ORAL | 1 refills | Status: DC

## 2024-07-08 MED ORDER — LISINOPRIL-HYDROCHLOROTHIAZIDE 10-12.5 MG PO TABS: 1.0000 | ORAL_TABLET | Freq: Every day | ORAL | 1 refills | Status: DC

## 2024-07-08 NOTE — Progress Notes (Signed)
 Endocrinology Consult Note                                            07/08/2024, 10:53 AM   Subjective:    Patient ID: Linda Rollins, female    DOB: 04-Mar-1971, PCP Maren Das, FNP   Past Medical History:  Diagnosis Date   Dizziness    Thyroid  disease    Vertigo    Past Surgical History:  Procedure Laterality Date   cortisone shots      [facility] @ [facility]; Dr. Kit (feet) and Dr. Kay (knees)   DENTAL SURGERY     wisdom teeth removal   LEEP     Social History   Socioeconomic History   Marital status: Single    Spouse name: Not on file   Number of children: 1   Years of education: Not on file   Highest education level: 12th grade  Occupational History   Not on file  Tobacco Use   Smoking status: Never   Smokeless tobacco: Never  Vaping Use   Vaping status: Never Used  Substance and Sexual Activity   Alcohol use: Never   Drug use: Never   Sexual activity: Not on file  Other Topics Concern   Not on file  Social History Narrative   Lives at home with her son   Right handed   Caffeine: no   Social Drivers of Corporate investment banker Strain: Low Risk  (03/30/2024)   Overall Financial Resource Strain (CARDIA)    Difficulty of Paying Living Expenses: Not very hard  Food Insecurity: No Food Insecurity (03/30/2024)   Hunger Vital Sign    Worried About Running Out of Food in the Last Year: Never true    Ran Out of Food in the Last Year: Never true  Transportation Needs: No Transportation Needs (03/30/2024)   PRAPARE - Administrator, Civil Service (Medical): No    Lack of Transportation (Non-Medical): No  Physical Activity: Inactive (03/30/2024)   Exercise Vital Sign    Days of Exercise per Week: 0 days    Minutes of Exercise per Session: 0 min  Stress: No Stress Concern Present (03/30/2024)   [employer] of Occupational Health - Occupational Stress Questionnaire    Feeling of Stress : Only a little  Social Connections:  Socially Isolated (03/30/2024)   Social Connection and Isolation Panel    Frequency of Communication with Friends and Family: Twice a week    Frequency of Social Gatherings with Friends and Family: Never    Attends Religious Services: Never    Database administrator or Organizations: No    Attends Engineer, structural: Never    Marital Status: Never married   Family History  Problem Relation Age of Onset   Diabetes Mother    Pulmonary embolism Mother    Hypertension Father    High blood pressure Other        father's side   Diabetes Other        mother's side   Heart disease Other        mother's side   Outpatient Encounter Medications as of 07/08/2024  Medication Sig   Acetaminophen 325 MG CAPS Take by mouth as needed.   Cyanocobalamin  (VITAMIN B-12 PO) Take 1 tablet by mouth daily.  ibuprofen (ADVIL,MOTRIN) 200 MG tablet Take 600 mg by mouth daily as needed for moderate pain.    lisinopril -hydrochlorothiazide  (ZESTORETIC ) 10-12.5 MG tablet Take 1 tablet by mouth daily.   MAGNESIUM GLYCINATE PO Take 1 tablet by mouth daily.   rosuvastatin  (CRESTOR ) 20 MG tablet Take 1 tablet (20 mg total) by mouth at bedtime.   Vitamin D -Vitamin K (VITAMIN K2-VITAMIN D3 PO) Take 1 tablet by mouth daily.   gabapentin (NEURONTIN) 300 MG capsule Take 300 mg by mouth 3 (three) times daily. (Patient not taking: Reported on 05/31/2024)   levothyroxine  (SYNTHROID ) 125 MCG tablet Take 1 tablet (125 mcg total) by mouth daily.   No facility-administered encounter medications on file as of 07/08/2024.   ALLERGIES: No Known Allergies  VACCINATION STATUS: Immunization History  Administered Date(s) Administered   Tdap 10/29/2005, 12/11/2016    HPI Linda Rollins is 53 y.o. female who presents today with a medical history as above. she is being seen in consultation for hypothyroidism requested by Zarwolo, Gloria, FNP. She was diagnosed approximate age of 88 with hypothyroidism.  Patient denies  any thyroid  surgery or ablation.  She has taken various dose of levothyroxine  over the years, currently on 125 mcg p.o. daily before breakfast.  She reports consistency and compliance with medication.  She denies cold intolerance, however reports progressive weight gain and fatigue. Patient also has severe dyslipidemia not on treatment.  She was found significantly elevated blood pressure.  Prior records show that elevated blood pressure on several occasions in the past.  She is not on treatment. Does not follow any particular diet or exercise program.  She does have family history of diabetes and coronary artery disease.  She denies dysphagia, dysphonia nor voice change.   Review of Systems  Constitutional: + Progressive weight gain, +  fatigue, no subjective hyperthermia, no subjective hypothermia Eyes: no blurry vision, no xerophthalmia ENT: no sore throat, no nodules palpated in throat, no dysphagia/odynophagia, no hoarseness Cardiovascular: no Chest Pain, no Shortness of Breath, no palpitations, no leg swelling Respiratory: no cough, no shortness of breath Gastrointestinal: no Nausea/Vomiting/Diarhhea Musculoskeletal: no muscle/joint aches Skin: no rashes Neurological: no tremors, no numbness, no tingling, no dizziness Psychiatric: no depression, no anxiety  Objective:       07/08/2024    9:41 AM 07/08/2024    9:08 AM 05/31/2024    9:42 AM  Vitals with BMI  Height  5' 3 5' 3  Weight  276 lbs 13 oz 272 lbs  BMI  49.05 48.19  Systolic 130 142 878  Diastolic 106 106 88  Pulse  64 102    BP (!) 130/106 Comment: L arm with manual cuff. Dr.Lizzet Hendley made aware. Pt prescribed antihypertensive medication per Dr.Jaykub Mackins.  Pulse 64   Ht 5' 3 (1.6 m)   Wt 276 lb 12.8 oz (125.6 kg)   LMP 06/29/2017   BMI 49.03 kg/m   Wt Readings from Last 3 Encounters:  07/08/24 276 lb 12.8 oz (125.6 kg)  05/31/24 272 lb (123.4 kg)  03/30/24 268 lb (121.6 kg)    Physical Exam  Constitutional:  Body  mass index is 49.03 kg/m.,  not in acute distress, normal state of mind Eyes: PERRLA, EOMI, no exophthalmos ENT: moist mucous membranes, + gross thyromegaly, no gross cervical lymphadenopathy, + acanthosis nigricans Cardiovascular: normal precordial activity, Regular Rate and Rhythm, no Murmur/Rubs/Gallops Respiratory:  adequate breathing efforts, no gross chest deformity, Clear to auscultation bilaterally Gastrointestinal: Obese, abdomen soft, Non -tender, No distension, Bowel Sounds present,  no gross organomegaly Musculoskeletal: no gross deformities, strength intact in all four extremities, no peripheral edema Skin: moist, warm, no rashes, + acanthosis nigricans Neurological: no tremor with outstretched hands, Deep tendon reflexes normal in bilateral lower extremities.  CMP ( most recent) CMP     Component Value Date/Time   NA 139 01/29/2024 0834   K 4.4 01/29/2024 0834   CL 103 01/29/2024 0834   CO2 22 01/29/2024 0834   GLUCOSE 89 01/29/2024 0834   GLUCOSE 110 (H) 08/15/2023 1755   BUN 14 01/29/2024 0834   CREATININE 0.95 01/29/2024 0834   CALCIUM  9.6 01/29/2024 0834   PROT 6.3 01/29/2024 0834   ALBUMIN 4.5 01/29/2024 0834   AST 17 01/29/2024 0834   ALT 11 01/29/2024 0834   ALKPHOS 108 01/29/2024 0834   BILITOT 0.6 01/29/2024 0834   EGFR 71 01/29/2024 0834   GFRNONAA >60 08/15/2023 1755     Diabetic Labs (most recent): Lab Results  Component Value Date   HGBA1C 5.0 01/29/2024   HGBA1C 4.9 09/15/2023   HGBA1C 4.9 05/13/2023     Lipid Panel ( most recent) Lipid Panel     Component Value Date/Time   CHOL 273 (H) 09/15/2023 1005   TRIG 124 09/15/2023 1005   HDL 44 09/15/2023 1005   CHOLHDL 6.2 (H) 09/15/2023 1005   LDLCALC 206 (H) 09/15/2023 1005   LABVLDL 23 09/15/2023 1005      Lab Results  Component Value Date   TSH 1.630 03/08/2024   TSH 5.330 (H) 01/29/2024   TSH 5.840 (H) 01/19/2024   TSH 5.570 (H) 09/15/2023   TSH 1.500 05/13/2023   FREET4 1.91  (H) 03/08/2024   FREET4 1.54 01/29/2024   FREET4 1.72 01/19/2024   FREET4 1.34 09/15/2023   FREET4 1.65 05/13/2023     Assessment & Plan:   1. Acquired hypothyroidism 2. Mixed hyperlipidemia 3. Essential hypertension, benign 4. Acanthosis nigricans 5. Morbid obesity (HCC) 6. Goiter  - Kirbie Stodghill Bondar  is being seen at a kind request of Zarwolo, Gloria, FNP. - I have reviewed her available  records and clinically evaluated the patient. - Based on these reviews, she has longstanding hypothyroidism, has not had recent thyroid  function tests. I think she gets a repeat thyroid  function test, she is advised to continue levothyroxine  125 mcg p.o. daily before breakfast.     - We discussed about the correct intake of her thyroid  hormone, on empty stomach at fasting, with water, separated by at least 30 minutes from breakfast and other medications,  and separated by more than 4 hours from calcium , iron, multivitamins, acid reflux medications (PPIs). -Patient is made aware of the fact that thyroid  hormone replacement is needed for life, dose to be adjusted by periodic monitoring of thyroid  function tests.  - Patient's major health concern is her high risk for cardiovascular morbidity and mortality. -She does have hypertension not on treatment, severe hyperlipidemia not on treatment.  She is an immediate candidate for intervention with statins antihypertensive medications. -She is approached for treatment with statins.  She hesitantly accepts prescription for Crestor  20 mg p.o. nightly.  Side effects and precautions discussed with him. I also discussed and initiated lisinopril /HCTZ 10-12.5 mg p.o. daily at breakfast.  -Her labs will include the following including antithyroid antibodies. - Comprehensive metabolic panel with GFR - Lipid panel - TSH - T4, free - Thyroid  peroxidase antibody - Thyroglobulin antibody - T3  In light of her clinical goiter, she is offered baseline thyroid   ultrasound.  She will have point-of-care A1c next visit.  - she is advised to maintain close follow up with Zarwolo, Gloria, FNP for primary care needs.   -Thank you for involving me in the care of this pleasant patient.  Time spent with the patient: 62  minutes spent in  counseling her about hypothyroidism, hyperlipidemia, hypertension, obesity, acanthosis nigricans and the rest in obtaining information about her symptoms, reviewing her previous labs/studies (including abstractions from other facilities),  evaluations, and treatments,  and developing a plan to confirm diagnosis and long term treatment based on the latest standards of care/guidelines; and documenting her care.  Linda Rollins participated in the discussions, expressed understanding, and voiced agreement with the above plans.  All questions were answered to her satisfaction. she is encouraged to contact clinic should she have any questions or concerns prior to her return visit.  Follow up plan: Return in about 2 weeks (around 07/22/2024) for Fasting Labs  in AM B4 8, Thyroid  / Neck Ultrasound, A1c -[place].   Ranny Earl, MD [facility] [address] [place], [place] [address] Phone: [phone]  Fax: [phone]     07/08/2024, 10:53 AM  This note was partially dictated with voice recognition software. Similar sounding words can be transcribed inadequately or may not  be corrected upon review.

## 2024-07-09 LAB — COMPREHENSIVE METABOLIC PANEL WITH GFR
ALT: 22 IU/L (ref 0–32)
AST: 21 IU/L (ref 0–40)
Albumin: 4.3 g/dL (ref 3.8–4.9)
Alkaline Phosphatase: 106 IU/L (ref 44–121)
BUN/Creatinine Ratio: 14 (ref 9–23)
BUN: 13 mg/dL (ref 6–24)
Bilirubin Total: 0.7 mg/dL (ref 0.0–1.2)
CO2: 20 mmol/L (ref 20–29)
Calcium: 9.9 mg/dL (ref 8.7–10.2)
Chloride: 103 mmol/L (ref 96–106)
Creatinine, Ser: 0.95 mg/dL (ref 0.57–1.00)
Globulin, Total: 2.1 g/dL (ref 1.5–4.5)
Glucose: 94 mg/dL (ref 70–99)
Potassium: 4.1 mmol/L (ref 3.5–5.2)
Sodium: 140 mmol/L (ref 134–144)
Total Protein: 6.4 g/dL (ref 6.0–8.5)
eGFR: 71 mL/min/1.73 (ref >59)

## 2024-07-09 LAB — LIPID PANEL
Chol/HDL Ratio: 5.7 ratio — ABNORMAL HIGH (ref 0.0–4.4)
Cholesterol, Total: 241 mg/dL — ABNORMAL HIGH (ref 100–199)
HDL: 42 mg/dL (ref >39)
LDL Chol Calc (NIH): 170 mg/dL — ABNORMAL HIGH (ref 0–99)
Triglycerides: 155 mg/dL — ABNORMAL HIGH (ref 0–149)
VLDL Cholesterol Cal: 29 mg/dL (ref 5–40)

## 2024-07-09 LAB — T4, FREE: Free T4: 1.67 ng/dL (ref 0.82–1.77)

## 2024-07-09 LAB — T3: T3, Total: 129 ng/dL (ref 71–180)

## 2024-07-09 LAB — TSH: TSH: 0.707 u[IU]/mL (ref 0.450–4.500)

## 2024-07-09 LAB — THYROID PEROXIDASE ANTIBODY: Thyroperoxidase Ab SerPl-aCnc: 15 [IU]/mL (ref 0–34)

## 2024-07-09 LAB — THYROGLOBULIN ANTIBODY: Thyroglobulin Antibody: 1919.1 [IU]/mL — ABNORMAL HIGH (ref 0.0–0.9)

## 2024-07-13 ENCOUNTER — Emergency Department (HOSPITAL_COMMUNITY)
Admission: EM | Admit: 2024-07-13 | Discharge: 2024-07-13 | Disposition: A | Discharge status: Home / Self Care | Source: Home / Self Care

## 2024-07-13 DIAGNOSIS — E669 Obesity, unspecified: Secondary | ICD-10-CM | POA: Diagnosis not present

## 2024-07-13 DIAGNOSIS — Z1152 Encounter for screening for COVID-19: Secondary | ICD-10-CM | POA: Diagnosis not present

## 2024-07-13 DIAGNOSIS — K8020 Calculus of gallbladder without cholecystitis without obstruction: Secondary | ICD-10-CM | POA: Diagnosis not present

## 2024-07-13 DIAGNOSIS — Z792 Long term (current) use of antibiotics: Secondary | ICD-10-CM | POA: Diagnosis not present

## 2024-07-13 DIAGNOSIS — Z7902 Long term (current) use of antithrombotics/antiplatelets: Secondary | ICD-10-CM | POA: Diagnosis not present

## 2024-07-13 DIAGNOSIS — Z79890 Hormone replacement therapy (postmenopausal): Secondary | ICD-10-CM | POA: Diagnosis not present

## 2024-07-13 DIAGNOSIS — B9689 Other specified bacterial agents as the cause of diseases classified elsewhere: Secondary | ICD-10-CM | POA: Diagnosis not present

## 2024-07-13 DIAGNOSIS — N390 Urinary tract infection, site not specified: Secondary | ICD-10-CM | POA: Diagnosis not present

## 2024-07-13 DIAGNOSIS — R6520 Severe sepsis without septic shock: Secondary | ICD-10-CM | POA: Diagnosis not present

## 2024-07-13 DIAGNOSIS — E785 Hyperlipidemia, unspecified: Secondary | ICD-10-CM | POA: Diagnosis not present

## 2024-07-13 DIAGNOSIS — Z79899 Other long term (current) drug therapy: Secondary | ICD-10-CM | POA: Diagnosis not present

## 2024-07-13 DIAGNOSIS — N179 Acute kidney failure, unspecified: Secondary | ICD-10-CM | POA: Diagnosis not present

## 2024-07-13 DIAGNOSIS — Z6841 Body Mass Index (BMI) 40.0 and over, adult: Secondary | ICD-10-CM | POA: Diagnosis not present

## 2024-07-13 DIAGNOSIS — N83201 Unspecified ovarian cyst, right side: Secondary | ICD-10-CM | POA: Diagnosis not present

## 2024-07-13 DIAGNOSIS — E039 Hypothyroidism, unspecified: Secondary | ICD-10-CM | POA: Diagnosis not present

## 2024-07-13 DIAGNOSIS — A419 Sepsis, unspecified organism: Secondary | ICD-10-CM | POA: Diagnosis not present

## 2024-07-13 DIAGNOSIS — Z604 Social exclusion and rejection: Secondary | ICD-10-CM | POA: Diagnosis not present

## 2024-07-13 DIAGNOSIS — Z1624 Resistance to multiple antibiotics: Secondary | ICD-10-CM | POA: Diagnosis not present

## 2024-07-13 DIAGNOSIS — N3001 Acute cystitis with hematuria: Secondary | ICD-10-CM | POA: Diagnosis not present

## 2024-07-13 DIAGNOSIS — I10 Essential (primary) hypertension: Secondary | ICD-10-CM | POA: Diagnosis not present

## 2024-07-13 DIAGNOSIS — N200 Calculus of kidney: Secondary | ICD-10-CM | POA: Diagnosis not present

## 2024-07-14 ENCOUNTER — Ambulatory Visit (HOSPITAL_COMMUNITY)

## 2024-07-19 ENCOUNTER — Telehealth: Payer: Self-pay

## 2024-07-19 NOTE — Transitions of Care (Post Inpatient/ED Visit) (Signed)
   07/19/2024  Name: Linda Rollins MRN: [id number] DOB: 04-25-1971  Today's TOC FU Call Status: Today's TOC FU Call Status:: Unsuccessful Call (1st Attempt) Unsuccessful Call (1st Attempt) Date: 07/19/24  Attempted to reach the patient regarding the most recent Inpatient/ED visit. Left a HIPAA approved voicemail message to phone number provided in demographics per DPR.    Follow Up Plan: Additional outreach attempts will be made to reach the patient to complete the Transitions of Care (Post Inpatient/ED visit) call.   Richerd Fish, RN, BSN, CCM [facility], [facility], [facility] RN Care Manager Direct Dial: [phone]

## 2024-07-20 ENCOUNTER — Telehealth: Payer: Self-pay

## 2024-07-20 NOTE — Transitions of Care (Post Inpatient/ED Visit) (Signed)
   07/20/2024  Name: Linda Rollins MRN: [id number] DOB: 1971-05-11  Today's TOC FU Call Status: Today's TOC FU Call Status:: Unsuccessful Call (2nd Attempt) Unsuccessful Call (2nd Attempt) Date: 07/20/24  Attempted to reach the patient regarding the most recent Inpatient/ED visit. Left a HIPAA approved voicemail message to phone number provided in demographics per DPR.    Follow Up Plan: Additional outreach attempts will be made to reach the patient to complete the Transitions of Care (Post Inpatient/ED visit) call.   Richerd Fish, RN, BSN, CCM [facility], [facility] RN Care Manager Direct Dial: [phone]

## 2024-07-21 ENCOUNTER — Telehealth: Payer: Self-pay

## 2024-07-21 NOTE — Transitions of Care (Post Inpatient/ED Visit) (Signed)
   07/21/2024  Name: Linda Rollins MRN: [id number] DOB: 1970/11/30  Today's TOC FU Call Status: Today's TOC FU Call Status:: Successful TOC FU Call Completed TOC FU Call Complete Date: 07/21/24 Patient's Name and Date of Birth confirmed.  Patient declined TOC program. Patient states she is following with Endocrinology and might be switching PCPs.    Shona Prow RN, CCM [place]  [facility] RN Care Manager [phone]

## 2024-07-22 ENCOUNTER — Ambulatory Visit (HOSPITAL_COMMUNITY)
Admission: RE | Admit: 2024-07-22 | Discharge: 2024-07-22 | Disposition: A | Discharge status: Home / Self Care | Source: Ambulatory Visit | Attending: "Endocrinology | Admitting: "Endocrinology

## 2024-07-22 DIAGNOSIS — E039 Hypothyroidism, unspecified: Secondary | ICD-10-CM | POA: Diagnosis not present

## 2024-07-26 ENCOUNTER — Encounter: Payer: Self-pay | Admitting: "Endocrinology

## 2024-07-26 ENCOUNTER — Ambulatory Visit: Admitting: "Endocrinology

## 2024-07-26 VITALS — BP 104/78 | HR 64 | Ht 63.0 in | Wt 269.2 lb

## 2024-07-26 DIAGNOSIS — L83 Acanthosis nigricans: Secondary | ICD-10-CM

## 2024-07-26 DIAGNOSIS — E039 Hypothyroidism, unspecified: Secondary | ICD-10-CM | POA: Diagnosis not present

## 2024-07-26 DIAGNOSIS — I10 Essential (primary) hypertension: Secondary | ICD-10-CM

## 2024-07-26 DIAGNOSIS — E782 Mixed hyperlipidemia: Secondary | ICD-10-CM | POA: Diagnosis not present

## 2024-07-26 DIAGNOSIS — E063 Autoimmune thyroiditis: Secondary | ICD-10-CM

## 2024-07-26 DIAGNOSIS — E049 Nontoxic goiter, unspecified: Secondary | ICD-10-CM

## 2024-07-26 LAB — POCT GLYCOSYLATED HEMOGLOBIN (HGB A1C): Hemoglobin A1C: 5 % (ref 4.0–5.6)

## 2024-07-26 MED ORDER — LEVOTHYROXINE SODIUM 125 MCG PO TABS: 125.0000 ug | ORAL_TABLET | Freq: Every day | ORAL | 1 refills | Status: DC

## 2024-07-26 NOTE — Progress Notes (Signed)
 07/26/2024, 11:36 AM   Endocrinology follow-up note  Subjective:    Patient ID: Linda Rollins, female    DOB: December 29, 1970, PCP Maren Das, FNP   Past Medical History:  Diagnosis Date   Dizziness    Thyroid  disease    Vertigo    Past Surgical History:  Procedure Laterality Date   cortisone shots      feet and knees @ [facility]; Dr. Kit (feet) and Dr. Kay (knees)   DENTAL SURGERY     wisdom teeth removal   LEEP     Social History   Socioeconomic History   Marital status: Single    Spouse name: Not on file   Number of children: 1   Years of education: Not on file   Highest education level: 12th grade  Occupational History   Not on file  Tobacco Use   Smoking status: Never   Smokeless tobacco: Never  Vaping Use   Vaping status: Never Used  Substance and Sexual Activity   Alcohol use: Never   Drug use: Never   Sexual activity: Not on file  Other Topics Concern   Not on file  Social History Narrative   Lives at home with her son   Right handed   Caffeine: no   Social Drivers of Corporate investment banker Strain: Low Risk  (03/30/2024)   Overall Financial Resource Strain (CARDIA)    Difficulty of Paying Living Expenses: Not very hard  Food Insecurity: No Food Insecurity (03/30/2024)   Hunger Vital Sign    Worried About Running Out of Food in the Last Year: Never true    Ran Out of Food in the Last Year: Never true  Transportation Needs: No Transportation Needs (03/30/2024)   PRAPARE - Administrator, Civil Service (Medical): No    Lack of Transportation (Non-Medical): No  Physical Activity: Inactive (07/13/2024)   Received from [facility]   Exercise Vital Sign    On average, how many days per week do you engage in moderate to strenuous exercise (like a brisk walk)?: 0 days    Minutes of Exercise per Session: Not on file  Stress: No Stress Concern Present (03/30/2024)   [employer] of Occupational Health - Occupational Stress Questionnaire    Feeling of Stress : Only a little  Social Connections: Socially Isolated (07/13/2024)   Received from [facility]   Social Connection and Isolation Panel    In a typical week, how many times do you talk on the phone with family, friends, or neighbors?: More than three times a week    How often do you get together with friends or relatives?: More than three times a week    How often do you attend church or religious services?: Never    Do you belong to any clubs or organizations such as church groups, unions, fraternal or athletic groups, or school groups?: No    How often do you attend meetings of the clubs or organizations you belong to?: Never    Are you married, widowed, divorced, separated, never married, or living with a partner?: Never married   Family History  Problem Relation Age of Onset  Diabetes Mother    Pulmonary embolism Mother    Hypertension Father    High blood pressure Other        father's side   Diabetes Other        mother's side   Heart disease Other        mother's side   Outpatient Encounter Medications as of 07/26/2024  Medication Sig   Acetaminophen 325 MG CAPS Take by mouth as needed.   Cyanocobalamin  (VITAMIN B-12 PO) Take 1 tablet by mouth daily.   gabapentin (NEURONTIN) 300 MG capsule Take 300 mg by mouth 3 (three) times daily. (Patient not taking: Reported on 05/31/2024)   ibuprofen (ADVIL,MOTRIN) 200 MG tablet Take 600 mg by mouth daily as needed for moderate pain.    levothyroxine  (SYNTHROID ) 125 MCG tablet Take 1 tablet (125 mcg total) by mouth daily.   lisinopril -hydrochlorothiazide  (ZESTORETIC ) 10-12.5 MG tablet Take 1 tablet by mouth daily.   MAGNESIUM GLYCINATE PO Take 1 tablet by mouth daily.   rosuvastatin  (CRESTOR ) 20 MG tablet Take 1 tablet (20 mg total) by mouth at bedtime.   Vitamin D -Vitamin K (VITAMIN K2-VITAMIN D3 PO) Take 1 tablet by mouth daily.   [DISCONTINUED]  levothyroxine  (SYNTHROID ) 125 MCG tablet Take 1 tablet (125 mcg total) by mouth daily.   No facility-administered encounter medications on file as of 07/26/2024.   ALLERGIES: No Known Allergies  VACCINATION STATUS: Immunization History  Administered Date(s) Administered   Tdap 10/29/2005, 12/11/2016    HPI Linda Rollins is 53 y.o. female who presents today with a medical history as above. she is being seen in follow-up after she was seen consultation for hypothyroidism requested by Zarwolo, Gloria, FNP. She was diagnosed at approximate age of 34 with hypothyroidism.  Patient denies any thyroid  surgery or ablation.  She has taken various dose of levothyroxine  over the years, currently on 125 mcg p.o. daily before breakfast.  She reports consistency and compliance with medication.  She denies cold intolerance, however reports progressive weight gain and fatigue. Her previsit thyroid  function tests are consistent with appropriate replacement, confirming etiology of Hashimoto's thyroiditis. Patient also has severe dyslipidemia , recently started on Crestor , tolerating very well.   She was also started on low-dose antihypertensive medication which helped control her blood pressure. During her last visit, she was found significantly elevated blood pressure.  Prior records show that elevated blood pressure on several occasions in the past.  She made some attempts to change her diet after dietary education was provided to her due to her acanthosis nigricans and risk of diabetes  She does have family history of diabetes and coronary artery disease.  She denies dysphagia, dysphonia nor voice change.   Review of Systems  Constitutional: + Presents with 7 pounds of weight loss since last visit,  +  fatigue, no subjective hyperthermia, no subjective hypothermia Eyes: no blurry vision, no xerophthalmia ENT: no sore throat, no nodules palpated in throat, no dysphagia/odynophagia, no  hoarseness   Objective:       07/26/2024    8:43 AM 07/08/2024    9:41 AM 07/08/2024    9:08 AM  Vitals with BMI  Height 5' 3  5' 3  Weight 269 lbs 3 oz  276 lbs 13 oz  BMI 47.7  49.05  Systolic 104 130 857  Diastolic 78 106 106  Pulse 64  64    BP 104/78   Pulse 64   Ht 5' 3 (1.6 m)   Wt 269 lb 3.2  oz (122.1 kg)   LMP 06/29/2017   BMI 47.69 kg/m   Wt Readings from Last 3 Encounters:  07/26/24 269 lb 3.2 oz (122.1 kg)  07/08/24 276 lb 12.8 oz (125.6 kg)  05/31/24 272 lb (123.4 kg)    Physical Exam  Constitutional:  Body mass index is 47.69 kg/m.,  not in acute distress, normal state of mind Eyes: PERRLA, EOMI, no exophthalmos ENT: moist mucous membranes, + gross thyromegaly, no gross cervical lymphadenopathy, + acanthosis nigricans  Skin: moist, warm, no rashes, + acanthosis nigricans Neurological: no tremor with outstretched hands, Deep tendon reflexes normal in bilateral lower extremities.  CMP ( most recent) CMP     Component Value Date/Time   NA 140 07/08/2024 0957   K 4.1 07/08/2024 0957   CL 103 07/08/2024 0957   CO2 20 07/08/2024 0957   GLUCOSE 94 07/08/2024 0957   GLUCOSE 110 (H) 08/15/2023 1755   BUN 13 07/08/2024 0957   CREATININE 0.95 07/08/2024 0957   CALCIUM  9.9 07/08/2024 0957   PROT 6.4 07/08/2024 0957   ALBUMIN 4.3 07/08/2024 0957   AST 21 07/08/2024 0957   ALT 22 07/08/2024 0957   ALKPHOS 106 07/08/2024 0957   BILITOT 0.7 07/08/2024 0957   EGFR 71 07/08/2024 0957   GFRNONAA >60 08/15/2023 1755     Diabetic Labs (most recent): Lab Results  Component Value Date   HGBA1C 5.0 07/26/2024   HGBA1C 5.0 01/29/2024   HGBA1C 4.9 09/15/2023     Lipid Panel ( most recent) Lipid Panel     Component Value Date/Time   CHOL 241 (H) 07/08/2024 0957   TRIG 155 (H) 07/08/2024 0957   HDL 42 07/08/2024 0957   CHOLHDL 5.7 (H) 07/08/2024 0957   LDLCALC 170 (H) 07/08/2024 0957   LABVLDL 29 07/08/2024 0957      Lab Results  Component  Value Date   TSH 0.707 07/08/2024   TSH 1.630 03/08/2024   TSH 5.330 (H) 01/29/2024   TSH 5.840 (H) 01/19/2024   TSH 5.570 (H) 09/15/2023   FREET4 1.67 07/08/2024   FREET4 1.91 (H) 03/08/2024   FREET4 1.54 01/29/2024   FREET4 1.72 01/19/2024   FREET4 1.34 09/15/2023    Thyroid  ultrasound on July 22, 2024 FINDINGS: Parenchymal Echotexture: Moderately heterogenous   Isthmus: 0.3 cm   Right lobe: 4.9 x 2.3 x 1.5 cm   Left lobe: 3.9 x 1.4 x 0.9 cm   Submitted thyroid  measurements are likely inaccurate.  See below.   IMPRESSION: Very difficult study to interpret due to heterogeneity of the thyroid  gland and poor visualization of the left lobe. The right lobe is also difficult to measure accurately. What was measured as a right lobe nodule is felt to most likely represent residual heterogeneous thyroid  tissue of a tiny right lobe and not a true nodule. The left lobe is extremely difficult to identify by ultrasound and what was measured is clearly not the thyroid  gland. Only a tiny left lobe was present in 2021 and on the current submitted images truly discernible left thyroid  tissue is not identifiable.  Assessment & Plan:   1. Acquired hypothyroidism 2. Mixed hyperlipidemia 3. Essential hypertension, benign 4. Acanthosis nigricans 5. Morbid obesity (HCC) 6. Goiter   - I have reviewed her  new and available  records and clinically evaluated the patient. - Based on these reviews, she has longstanding hypothyroidism, recent labs confirming Hashimoto thyroiditis and labs consistent with appropriate replacement.  She is advised to continue levothyroxine  125 mcg  p.o. daily before breakfast.    - We discussed about the correct intake of her thyroid  hormone, on empty stomach at fasting, with water, separated by at least 30 minutes from breakfast and other medications,  and separated by more than 4 hours from calcium , iron, multivitamins, acid reflux medications  (PPIs). -Patient is made aware of the fact that thyroid  hormone replacement is needed for life, dose to be adjusted by periodic monitoring of thyroid  function tests.  -She has responded to lisinopril /HCTZ recheck her controlled blood pressure to target.  She is advised to continue.  She was also initiated on Crestor  20 mg p.o. nightly which she tolerated and advised to continue.   Side effects and precautions discussed with him.  In light of her clinical goiter, and inconclusive ultrasound findings, she will be considered for CT scan after her next visit.  Her point-of-care A1c is 5% today.   - she acknowledges that there is a room for improvement in her food and drink choices. - Suggestion is made for her to avoid simple carbohydrates  from her diet including Cakes, Sweet Desserts, Ice Cream, Soda (diet and regular), Sweet Tea, Candies, Chips, Cookies, Store Bought Juices, Alcohol , Artificial Sweeteners,  Coffee Creamer, and Sugar-free Products, Lemonade. This will help patient to have more stable blood glucose profile and potentially avoid unintended weight gain.   - she is advised to maintain close follow up with Zarwolo, Gloria, FNP for primary care needs.   I spent  40  minutes in the care of the patient today including review of labs from Thyroid  Function, CMP, and other relevant labs ; imaging/biopsy records (current and previous including abstractions from other facilities); face-to-face time discussing  her lab results and symptoms, medications doses, her options of short and long term treatment based on the latest standards of care / guidelines;   and documenting the encounter.  Lorette CHRISTELLA Rung  participated in the discussions, expressed understanding, and voiced agreement with the above plans.  All questions were answered to her satisfaction. she is encouraged to contact clinic should she have any questions or concerns prior to her return visit.   Follow up plan: Return in about 3  months (around 10/25/2024) for Fasting Labs  in AM B4 8.   Ranny Earl, MD [facility] [address] [place], [place] [address] Phone: [phone]  Fax: [phone]     07/26/2024, 11:36 AM  This note was partially dictated with voice recognition software. Similar sounding words can be transcribed inadequately or may not  be corrected upon review.

## 2024-07-26 NOTE — Patient Instructions (Signed)

## 2024-08-09 ENCOUNTER — Encounter: Payer: Self-pay | Admitting: Family Medicine

## 2024-08-09 ENCOUNTER — Ambulatory Visit (INDEPENDENT_AMBULATORY_CARE_PROVIDER_SITE_OTHER): Payer: Self-pay | Admitting: Family Medicine

## 2024-08-09 VITALS — BP 104/72 | HR 132 | Resp 16 | Ht 63.0 in | Wt 271.1 lb

## 2024-08-09 DIAGNOSIS — R6520 Severe sepsis without septic shock: Secondary | ICD-10-CM | POA: Diagnosis not present

## 2024-08-09 DIAGNOSIS — A419 Sepsis, unspecified organism: Secondary | ICD-10-CM | POA: Diagnosis not present

## 2024-08-09 DIAGNOSIS — N178 Other acute kidney failure: Secondary | ICD-10-CM

## 2024-08-09 NOTE — Progress Notes (Signed)
 Established Patient Office Visit  Subjective:  Patient ID: Linda Rollins, female    DOB: 06-27-1971  Age: 53 y.o. MRN: [id number]  CC:  Chief Complaint  Patient presents with   Hospitalization Follow-up    Admitted 09/16 for sepsis     HPI Linda Rollins is a 53 y.o. female with past medical history of hypertension, hypothyroidism, obesity presents for f/u of  chronic medical conditions.   The patient was seen and admitted on 07/13/2024 for sepsis secondary to acute cystitis with hematuria, which resulted in an acute kidney injury (AKI). She was treated with IV Zosyn during hospitalization and discharged home on oral ciprofloxacin for 7 days.  The patient reports completing the full course of antibiotics and is currently asymptomatic. She denies left flank pain, nausea, diaphoresis, decreased appetite, or dizziness.   Past Medical History:  Diagnosis Date   Dizziness    Thyroid  disease    Vertigo     Past Surgical History:  Procedure Laterality Date   cortisone shots      feet and knees @ [facility]; Dr. Kit (feet) and Dr. Kay (knees)   DENTAL SURGERY     wisdom teeth removal   LEEP      Family History  Problem Relation Age of Onset   Diabetes Mother    Pulmonary embolism Mother    Hypertension Father    High blood pressure Other        father's side   Diabetes Other        mother's side   Heart disease Other        mother's side    Social History   Socioeconomic History   Marital status: Single    Spouse name: Not on file   Number of children: 1   Years of education: Not on file   Highest education level: 12th grade  Occupational History   Not on file  Tobacco Use   Smoking status: Never   Smokeless tobacco: Never  Vaping Use   Vaping status: Never Used  Substance and Sexual Activity   Alcohol use: Never   Drug use: Never   Sexual activity: Not on file  Other Topics Concern   Not on file  Social History Narrative   Lives at home with  her son   Right handed   Caffeine: no   Social Drivers of Corporate investment banker Strain: Low Risk  (03/30/2024)   Overall Financial Resource Strain (CARDIA)    Difficulty of Paying Living Expenses: Not very hard  Food Insecurity: No Food Insecurity (03/30/2024)   Hunger Vital Sign    Worried About Running Out of Food in the Last Year: Never true    Ran Out of Food in the Last Year: Never true  Transportation Needs: No Transportation Needs (03/30/2024)   PRAPARE - Administrator, Civil Service (Medical): No    Lack of Transportation (Non-Medical): No  Physical Activity: Inactive (07/13/2024)   Received from [facility]   Exercise Vital Sign    On average, how many days per week do you engage in moderate to strenuous exercise (like a brisk walk)?: 0 days    Minutes of Exercise per Session: Not on file  Stress: No Stress Concern Present (03/30/2024)   [employer] of Occupational Health - Occupational Stress Questionnaire    Feeling of Stress : Only a little  Social Connections: Socially Isolated (07/13/2024)   Received from [facility]  Social Connection and Isolation Panel    In a typical week, how many times do you talk on the phone with family, friends, or neighbors?: More than three times a week    How often do you get together with friends or relatives?: More than three times a week    How often do you attend church or religious services?: Never    Do you belong to any clubs or organizations such as church groups, unions, fraternal or athletic groups, or school groups?: No    How often do you attend meetings of the clubs or organizations you belong to?: Never    Are you married, widowed, divorced, separated, never married, or living with a partner?: Never married  Intimate Partner Violence: Not At Risk (03/30/2024)   Humiliation, Afraid, Rape, and Kick questionnaire    Fear of Current or Ex-Partner: No    Emotionally Abused: No    Physically Abused: No     Sexually Abused: No    Outpatient Medications Prior to Visit  Medication Sig Dispense Refill   Acetaminophen 325 MG CAPS Take by mouth as needed.     Cyanocobalamin  (VITAMIN B-12 PO) Take 1 tablet by mouth daily.     ibuprofen (ADVIL,MOTRIN) 200 MG tablet Take 600 mg by mouth daily as needed for moderate pain.      levothyroxine  (SYNTHROID ) 125 MCG tablet Take 1 tablet (125 mcg total) by mouth daily. 90 tablet 1   lisinopril -hydrochlorothiazide  (ZESTORETIC ) 10-12.5 MG tablet Take 1 tablet by mouth daily. 90 tablet 1   MAGNESIUM GLYCINATE PO Take 1 tablet by mouth daily.     rosuvastatin  (CRESTOR ) 20 MG tablet Take 1 tablet (20 mg total) by mouth at bedtime. 90 tablet 1   Vitamin D -Vitamin K (VITAMIN K2-VITAMIN D3 PO) Take 1 tablet by mouth daily.     gabapentin (NEURONTIN) 300 MG capsule Take 300 mg by mouth 3 (three) times daily. (Patient not taking: Reported on 08/09/2024)     No facility-administered medications prior to visit.    No Known Allergies  ROS Review of Systems  Constitutional:  Negative for chills and fever.  Eyes:  Negative for visual disturbance.  Respiratory:  Negative for chest tightness and shortness of breath.   Genitourinary:  Negative for decreased urine volume, flank pain, frequency and urgency.  Neurological:  Negative for dizziness and headaches.      Objective:    Physical Exam HENT:     Head: Normocephalic.     Mouth/Throat:     Mouth: Mucous membranes are moist.  Cardiovascular:     Rate and Rhythm: Normal rate.     Heart sounds: Normal heart sounds.  Pulmonary:     Effort: Pulmonary effort is normal.     Breath sounds: Normal breath sounds.  Neurological:     Mental Status: She is alert.     BP 104/72   Pulse (!) 132   Resp 16   Ht 5' 3 (1.6 m)   Wt 271 lb 1.9 oz (123 kg)   LMP 06/29/2017   SpO2 98%   BMI 48.03 kg/m  Wt Readings from Last 3 Encounters:  08/09/24 271 lb 1.9 oz (123 kg)  07/26/24 269 lb 3.2 oz (122.1 kg)   07/08/24 276 lb 12.8 oz (125.6 kg)    Lab Results  Component Value Date   TSH 0.707 07/08/2024   Lab Results  Component Value Date   WBC 10.7 01/29/2024   HGB 13.9 01/29/2024   HCT 42.0  01/29/2024   MCV 87 01/29/2024   PLT 444 01/29/2024   Lab Results  Component Value Date   NA 140 07/08/2024   K 4.1 07/08/2024   CO2 20 07/08/2024   GLUCOSE 94 07/08/2024   BUN 13 07/08/2024   CREATININE 0.95 07/08/2024   BILITOT 0.7 07/08/2024   ALKPHOS 106 07/08/2024   AST 21 07/08/2024   ALT 22 07/08/2024   PROT 6.4 07/08/2024   ALBUMIN 4.3 07/08/2024   CALCIUM  9.9 07/08/2024   ANIONGAP 11 08/15/2023   EGFR 71 07/08/2024   Lab Results  Component Value Date   CHOL 241 (H) 07/08/2024   Lab Results  Component Value Date   HDL 42 07/08/2024   Lab Results  Component Value Date   LDLCALC 170 (H) 07/08/2024   Lab Results  Component Value Date   TRIG 155 (H) 07/08/2024   Lab Results  Component Value Date   CHOLHDL 5.7 (H) 07/08/2024   Lab Results  Component Value Date   HGBA1C 5.0 07/26/2024      Assessment & Plan:  Sepsis with other acute renal failure without septic shock, due to unspecified organism [facility]) Assessment & Plan:   Pending: cbc and bmp  Education -- UTI Prevention: Reviewed tips to help prevent future urinary tract infections (UTIs): Avoid prolonged urination: Do not hold urine for extended periods, as this can promote bacterial growth. Empty the bladder promptly: Urinate as soon as you feel the urge. Post-intercourse hygiene: Empty your bladder soon after intercourse to reduce infection risk. Shower instead of bathe: Opt for showers to minimize bacterial exposure. Proper wiping technique: Wipe front to back after urination and bowel movements to prevent bacteria transfer.   Orders: -     BMP8+EGFR -     CBC with Differential/Platelet -     Urinalysis  Note: This chart has been completed using Engineer, civil (consulting) software, and while  attempts have been made to ensure accuracy, certain words and phrases may not be transcribed as intended.    Follow-up: No follow-ups on file.   Nghia Mcentee  Z Bacchus, FNP

## 2024-08-09 NOTE — Assessment & Plan Note (Signed)
   Pending: cbc and bmp  Education -- UTI Prevention: Reviewed tips to help prevent future urinary tract infections (UTIs): Avoid prolonged urination: Do not hold urine for extended periods, as this can promote bacterial growth. Empty the bladder promptly: Urinate as soon as you feel the urge. Post-intercourse hygiene: Empty your bladder soon after intercourse to reduce infection risk. Shower instead of bathe: Opt for showers to minimize bacterial exposure. Proper wiping technique: Wipe front to back after urination and bowel movements to prevent bacteria transfer.

## 2024-08-09 NOTE — Patient Instructions (Addendum)
 I appreciate the opportunity to provide care to you today!   Labs: please stop by the lab today to get your blood drawn (CBC, BMP)  To help prevent future urinary tract infections (UTIs), consider the following practices:  Avoid Prolonged Urination: Do not hold urine for extended periods, as this can stretch the bladder and create an environment conducive to bacterial growth. Prompt Bladder Emptying: Empty your bladder as soon as you feel the urge. Post-Intercourse Hygiene: Empty your bladder soon after intercourse to reduce the risk of infection. Shower Instead of Bath: Opt for showers rather than baths to minimize bacterial exposure. Proper Wiping Technique: Wipe from front to back after urinating and bowel movements to prevent the transfer of bacteria from the anal region to the vagina and urethra. Hydration: Drink a full glass of water regularly to help flush bacteria from your system.   Please follow up if your symptoms worsen or fail to improve.    Please continue to a heart-healthy diet and increase your physical activities. Try to exercise for at least five days a week.    It was a pleasure to see you and I look forward to continuing to work together on your health and well-being. Please do not hesitate to call the office if you need care or have questions about your care.  In case of emergency, please visit the Emergency Department for urgent care, or contact our clinic at [phone] to schedule an appointment. We're here to help you!   Have a wonderful day and week. With Gratitude, Meade JENEANE Gerlach MSN, FNP-BC, PMHNP-BC

## 2024-08-10 LAB — BMP8+EGFR
BUN/Creatinine Ratio: 18 (ref 9–23)
BUN: 17 mg/dL (ref 6–24)
CO2: 19 mmol/L — ABNORMAL LOW (ref 20–29)
Calcium: 10.4 mg/dL — ABNORMAL HIGH (ref 8.7–10.2)
Chloride: 102 mmol/L (ref 96–106)
Creatinine, Ser: 0.96 mg/dL (ref 0.57–1.00)
Glucose: 127 mg/dL — ABNORMAL HIGH (ref 70–99)
Potassium: 4.3 mmol/L (ref 3.5–5.2)
Sodium: 138 mmol/L (ref 134–144)
eGFR: 70 mL/min/1.73 (ref >59)

## 2024-08-10 LAB — CBC WITH DIFFERENTIAL/PLATELET
Basophils Absolute: 0.1 x10E3/uL (ref 0.0–0.2)
Basos: 0 %
EOS (ABSOLUTE): 0.1 x10E3/uL (ref 0.0–0.4)
Eos: 1 %
Hematocrit: 39.7 % (ref 34.0–46.6)
Hemoglobin: 13.2 g/dL (ref 11.1–15.9)
Immature Grans (Abs): 0.3 x10E3/uL — ABNORMAL HIGH (ref 0.0–0.1)
Immature Granulocytes: 2 %
Lymphocytes Absolute: 4 x10E3/uL — ABNORMAL HIGH (ref 0.7–3.1)
Lymphs: 25 %
MCH: 29.7 pg (ref 26.6–33.0)
MCHC: 33.2 g/dL (ref 31.5–35.7)
MCV: 89 fL (ref 79–97)
Monocytes Absolute: 0.9 x10E3/uL (ref 0.1–0.9)
Monocytes: 6 %
Neutrophils Absolute: 10.7 x10E3/uL — ABNORMAL HIGH (ref 1.4–7.0)
Neutrophils: 66 %
Platelets: 439 x10E3/uL (ref 150–450)
RBC: 4.45 x10E6/uL (ref 3.77–5.28)
RDW: 12.2 % (ref 11.7–15.4)
WBC: 15.9 x10E3/uL — ABNORMAL HIGH (ref 3.4–10.8)

## 2024-08-10 LAB — URINALYSIS
Bilirubin, UA: NEGATIVE
Glucose, UA: NEGATIVE
Ketones, UA: NEGATIVE
Nitrite, UA: NEGATIVE
Protein,UA: NEGATIVE
RBC, UA: NEGATIVE
Specific Gravity, UA: 1.023 (ref 1.005–1.030)
Urobilinogen, Ur: 0.2 mg/dL (ref 0.2–1.0)
pH, UA: 6 (ref 5.0–7.5)

## 2024-08-11 ENCOUNTER — Telehealth: Payer: Self-pay

## 2024-08-11 NOTE — Telephone Encounter (Signed)
 Copied from CRM [id number]. Topic: Clinical - Lab/Test Results >> Aug 11, 2024  9:01 AM Wess RAMAN wrote: Reason for CRM: Patient would like lab results.  Callback #: [id number]

## 2024-08-11 NOTE — Telephone Encounter (Signed)
Waiting for provider to review.

## 2024-08-13 ENCOUNTER — Ambulatory Visit: Payer: Self-pay | Admitting: Family Medicine

## 2024-08-23 ENCOUNTER — Telehealth: Payer: Self-pay

## 2024-08-23 NOTE — Telephone Encounter (Signed)
 Pt called stating she has been taking Rosuvastatin  10mg  daily in the evening. States she has been experiencing nausea and abdominal pain each night she takes crestor . States she did not take Rosuvastatin  for 2 nights and did not experience symptoms but as soon as she started the Rosuvastatin  her symptoms return. Pt requested something else to replace the Rosuvastatin .

## 2024-08-23 NOTE — Telephone Encounter (Signed)
 Lets have Dr. Lenis address this when he gets back.  Im not sure if he would switch to a different statin or go in a different direction altogether.

## 2024-08-31 ENCOUNTER — Other Ambulatory Visit: Payer: Self-pay | Admitting: "Endocrinology

## 2024-08-31 MED ORDER — PRAVASTATIN SODIUM 20 MG PO TABS: 20.0000 mg | ORAL_TABLET | Freq: Every day | ORAL | 1 refills | Status: DC

## 2024-08-31 NOTE — Telephone Encounter (Signed)
 Spoke with Linda Rollins advising her Dr. Lenis will prescribe Pravastatin to replace Rosuvastatin  per Dr. Lenis. Linda Rollins voiced understanding.

## 2024-09-09 ENCOUNTER — Ambulatory Visit: Admitting: Family Medicine

## 2024-09-10 ENCOUNTER — Ambulatory Visit

## 2024-09-13 ENCOUNTER — Other Ambulatory Visit: Payer: Self-pay | Admitting: Family Medicine

## 2024-09-13 DIAGNOSIS — Z1231 Encounter for screening mammogram for malignant neoplasm of breast: Secondary | ICD-10-CM

## 2024-09-27 ENCOUNTER — Ambulatory Visit: Payer: Self-pay

## 2024-10-06 ENCOUNTER — Encounter: Payer: Self-pay | Admitting: Family Medicine

## 2024-10-08 ENCOUNTER — Ambulatory Visit: Payer: Self-pay

## 2024-10-08 ENCOUNTER — Ambulatory Visit (INDEPENDENT_AMBULATORY_CARE_PROVIDER_SITE_OTHER): Payer: Self-pay

## 2024-10-08 VITALS — BP 97/74 | HR 126 | Ht 63.0 in | Wt 270.0 lb

## 2024-10-08 DIAGNOSIS — E063 Autoimmune thyroiditis: Secondary | ICD-10-CM

## 2024-10-08 DIAGNOSIS — I10 Essential (primary) hypertension: Secondary | ICD-10-CM

## 2024-10-08 DIAGNOSIS — D72828 Other elevated white blood cell count: Secondary | ICD-10-CM

## 2024-10-08 NOTE — Progress Notes (Signed)
 Established Patient Office Visit  Subjective   Patient ID: Linda Rollins, female    DOB: Jul 04, 1971  Age: 53 y.o. MRN: [id number]  Chief Complaint  Patient presents with   Medical Management of Chronic Issues    Pt here for Chronic Nausea     HPI Discussed the use of AI scribe software for clinical note transcription with the patient, who gave verbal consent to proceed.  History of Present Illness    ZAKYAH Rollins is a 53 year old female who presents for a routine follow-up visit.  Weight management - Difficulty losing weight - Expresses concern about current weight  Balance disturbance - Experiencing issues with balance  Laboratory testing and results - Interested in checking ferritin levels - No anemia - Inquires about adrenal testing due to research on thyroid  issues - Thyroxine managed by another provider - Not currently taking gabapentin - Recalls ultrasound performed a couple of months ago with normal results - Previous hospital visit with lab results that were concerning to her, but no follow-up was provided     Patient Active Problem List   Diagnosis Date Noted   Hypercalcemia 10/10/2024   Leucocytosis 10/10/2024   Sepsis with acute renal failure (HCC) 08/09/2024   Essential hypertension, benign 07/08/2024   Acanthosis nigricans 07/08/2024   Goiter 07/08/2024   Mixed hyperlipidemia 01/29/2024   Morbid obesity (HCC) 03/05/2023   Cervical cancer screening 02/10/2023   Hypothyroidism due to Hashimoto's thyroiditis 11/06/2020   Hashimoto's disease 02/02/2020   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   Myelopathy (HCC) 02/02/2020   Benign paroxysmal positional vertigo of right ear 10/21/2019   Gait abnormality 10/21/2019   Dizziness 10/08/2019   Morbid obesity with BMI of 40.0-44.9, adult (HCC) 10/08/2019   Orthostasis 05/17/2019   Imbalance 12/15/2018   Hypothyroidism 01/22/2013   SHOULDER PAIN 09/04/2009   CERVICALGIA 09/04/2009   IMPINGEMENT  SYNDROME 09/04/2009   ANKLE INSTABILITY 08/15/2008    ROS    Objective:     BP 97/74   Pulse (!) 126   Ht 5' 3 (1.6 m)   Wt 270 lb (122.5 kg)   LMP 06/29/2017   SpO2 94%   BMI 47.83 kg/m  BP Readings from Last 3 Encounters:  10/08/24 97/74  08/09/24 104/72  07/26/24 104/78   Wt Readings from Last 3 Encounters:  10/08/24 270 lb (122.5 kg)  08/09/24 271 lb 1.9 oz (123 kg)  07/26/24 269 lb 3.2 oz (122.1 kg)     Physical Exam Vitals and nursing note reviewed.  Constitutional:      Appearance: Normal appearance.  HENT:     Head: Normocephalic.     Right Ear: Tympanic membrane, ear canal and external ear normal.     Left Ear: Tympanic membrane, ear canal and external ear normal.     Nose: Nose normal.     Mouth/Throat:     Mouth: Mucous membranes are moist.     Pharynx: Oropharynx is clear.  Cardiovascular:     Rate and Rhythm: Normal rate and regular rhythm.  Pulmonary:     Effort: Pulmonary effort is normal.     Breath sounds: Normal breath sounds.  Musculoskeletal:     Cervical back: Normal range of motion and neck supple.  Skin:    General: Skin is warm and dry.  Neurological:     Mental Status: She is alert and oriented to person, place, and time.  Psychiatric:        Mood and Affect: Mood  normal.        Thought Content: Thought content normal.       Last CBC Lab Results  Component Value Date   WBC 11.3 (H) 10/08/2024   HGB 13.3 10/08/2024   HCT 42.0 10/08/2024   MCV 91 10/08/2024   MCH 28.9 10/08/2024   RDW 11.9 10/08/2024   PLT 415 10/08/2024   Last metabolic panel Lab Results  Component Value Date   GLUCOSE 103 (H) 10/08/2024   NA 139 10/08/2024   K 4.2 10/08/2024   CL 100 10/08/2024   CO2 21 10/08/2024   BUN 14 10/08/2024   CREATININE 1.05 (H) 10/08/2024   EGFR 63 10/08/2024   CALCIUM  10.2 10/08/2024   PROT 6.4 07/08/2024   ALBUMIN 4.3 07/08/2024   LABGLOB 2.1 07/08/2024   AGRATIO 2.2 11/21/2022   BILITOT 0.7 07/08/2024    ALKPHOS 106 07/08/2024   AST 21 07/08/2024   ALT 22 07/08/2024   ANIONGAP 11 08/15/2023   Last lipids Lab Results  Component Value Date   CHOL 241 (H) 07/08/2024   HDL 42 07/08/2024   LDLCALC 170 (H) 07/08/2024   TRIG 155 (H) 07/08/2024   CHOLHDL 5.7 (H) 07/08/2024   Last hemoglobin A1c Lab Results  Component Value Date   HGBA1C 5.0 07/26/2024   Last thyroid  functions Lab Results  Component Value Date   TSH 0.073 (L) 10/08/2024   T3TOTAL 129 07/08/2024   T4TOTAL 7.5 05/24/2013   FREET4 2.06 (H) 10/08/2024   THYROIDAB 15 07/08/2024   Last vitamin D  Lab Results  Component Value Date   VD25OH 37.2 01/29/2024   Last vitamin B12 and Folate No results found for: VITAMINB12, FOLATE    The 10-year ASCVD risk score (Arnett DK, et al., 2019) is: 2.7%    Assessment & Plan:   Problem List Items Addressed This Visit       Cardiovascular and Mediastinum   Essential hypertension, benign - Primary   Stable with current medications.  No changes made at today's visit.  Check labs.       Relevant Orders   Basic Metabolic Panel (BMET) (Completed)     Endocrine   Hypothyroidism due to Hashimoto's thyroiditis   Recheck thyroid  levels.       Relevant Orders   TSH + free T4 (Completed)     Other   Hypercalcemia   Recheck calcium  as well as parathyroid levels.       Relevant Orders   Basic Metabolic Panel (BMET) (Completed)   Parathyroid hormone, intact (no Ca) (Completed)   Leucocytosis   Previous labs showed abnormalities, possibly due to recent illness. No immediate concerns identified. - Rechecked labs to monitor white blood cell count.      Relevant Orders   CBC with Differential/Platelet (Completed)   Return in about 6 months (around 04/08/2025) for chronic follow-up with PCP.    Leita Longs, FNP

## 2024-10-10 DIAGNOSIS — D72829 Elevated white blood cell count, unspecified: Secondary | ICD-10-CM | POA: Insufficient documentation

## 2024-10-10 LAB — CBC WITH DIFFERENTIAL/PLATELET
Basophils Absolute: 0 x10E3/uL (ref 0.0–0.2)
Basos: 0 %
EOS (ABSOLUTE): 0.1 x10E3/uL (ref 0.0–0.4)
Eos: 1 %
Hematocrit: 42 % (ref 34.0–46.6)
Hemoglobin: 13.3 g/dL (ref 11.1–15.9)
Immature Grans (Abs): 0 x10E3/uL (ref 0.0–0.1)
Immature Granulocytes: 0 %
Lymphocytes Absolute: 2.7 x10E3/uL (ref 0.7–3.1)
Lymphs: 24 %
MCH: 28.9 pg (ref 26.6–33.0)
MCHC: 31.7 g/dL (ref 31.5–35.7)
MCV: 91 fL (ref 79–97)
Monocytes Absolute: 0.8 x10E3/uL (ref 0.1–0.9)
Monocytes: 7 %
Neutrophils Absolute: 7.6 x10E3/uL — ABNORMAL HIGH (ref 1.4–7.0)
Neutrophils: 68 %
Platelets: 415 x10E3/uL (ref 150–450)
RBC: 4.61 x10E6/uL (ref 3.77–5.28)
RDW: 11.9 % (ref 11.7–15.4)
WBC: 11.3 x10E3/uL — ABNORMAL HIGH (ref 3.4–10.8)

## 2024-10-10 LAB — BASIC METABOLIC PANEL WITH GFR
BUN/Creatinine Ratio: 13 (ref 9–23)
BUN: 14 mg/dL (ref 6–24)
CO2: 21 mmol/L (ref 20–29)
Calcium: 10.2 mg/dL (ref 8.7–10.2)
Chloride: 100 mmol/L (ref 96–106)
Creatinine, Ser: 1.05 mg/dL — ABNORMAL HIGH (ref 0.57–1.00)
Glucose: 103 mg/dL — ABNORMAL HIGH (ref 70–99)
Potassium: 4.2 mmol/L (ref 3.5–5.2)
Sodium: 139 mmol/L (ref 134–144)
eGFR: 63 mL/min/1.73 (ref >59)

## 2024-10-10 LAB — TSH+FREE T4
Free T4: 2.06 ng/dL — ABNORMAL HIGH (ref 0.82–1.77)
TSH: 0.073 u[IU]/mL — ABNORMAL LOW (ref 0.450–4.500)

## 2024-10-10 LAB — PARATHYROID HORMONE, INTACT (NO CA): PTH: 37 pg/mL (ref 15–65)

## 2024-10-10 NOTE — Assessment & Plan Note (Signed)
 Previous labs showed abnormalities, possibly due to recent illness. No immediate concerns identified. - Rechecked labs to monitor white blood cell count.

## 2024-10-10 NOTE — Assessment & Plan Note (Signed)
Recheck thyroid levels 

## 2024-10-10 NOTE — Assessment & Plan Note (Signed)
 Stable with current medications.  No changes made at today's visit.  Check labs.

## 2024-10-10 NOTE — Assessment & Plan Note (Signed)
 Recheck calcium  as well as parathyroid levels.

## 2024-10-11 ENCOUNTER — Other Ambulatory Visit: Payer: Self-pay | Admitting: "Endocrinology

## 2024-10-11 ENCOUNTER — Telehealth: Payer: Self-pay | Admitting: "Endocrinology

## 2024-10-11 ENCOUNTER — Ambulatory Visit

## 2024-10-11 DIAGNOSIS — E063 Autoimmune thyroiditis: Secondary | ICD-10-CM

## 2024-10-11 MED ORDER — LEVOTHYROXINE SODIUM 112 MCG PO TABS: 112.0000 ug | ORAL_TABLET | Freq: Every day | ORAL | 0 refills | Status: DC

## 2024-10-11 NOTE — Telephone Encounter (Signed)
 Pt had some of your labs done for PCP on 10/08/24.  Will you use those for her visit on 10/27/24 or does she need to do yours?

## 2024-10-11 NOTE — Telephone Encounter (Signed)
 Spoke with pt making her aware the labs she had drawn on 12/12 are sufficient for her upcoming visit and based on her latest labs Dr.Nida suggest decreasing dose of Levothyroxine  to 112mcg daily and has sent in a Rx to [facility], advised pt start the 112mcg dose tomorrow per Dr. Barbette orders. Pt voiced understanding.

## 2024-10-14 ENCOUNTER — Ambulatory Visit

## 2024-10-19 ENCOUNTER — Ambulatory Visit

## 2024-10-26 ENCOUNTER — Telehealth: Payer: Self-pay | Admitting: "Endocrinology

## 2024-10-26 NOTE — Telephone Encounter (Signed)
 Will you see pt tomorrow with partial labs

## 2024-10-27 ENCOUNTER — Ambulatory Visit: Admitting: "Endocrinology

## 2024-10-27 ENCOUNTER — Telehealth: Payer: Self-pay

## 2024-10-27 ENCOUNTER — Encounter: Payer: Self-pay | Admitting: "Endocrinology

## 2024-10-27 VITALS — BP 98/60 | HR 116 | Resp 18 | Ht 63.0 in | Wt 264.4 lb

## 2024-10-27 DIAGNOSIS — E063 Autoimmune thyroiditis: Secondary | ICD-10-CM

## 2024-10-27 DIAGNOSIS — L83 Acanthosis nigricans: Secondary | ICD-10-CM | POA: Diagnosis not present

## 2024-10-27 DIAGNOSIS — I10 Essential (primary) hypertension: Secondary | ICD-10-CM

## 2024-10-27 DIAGNOSIS — E782 Mixed hyperlipidemia: Secondary | ICD-10-CM

## 2024-10-27 DIAGNOSIS — E039 Hypothyroidism, unspecified: Secondary | ICD-10-CM

## 2024-10-27 MED ORDER — ROSUVASTATIN CALCIUM 20 MG PO TABS: 20.0000 mg | ORAL_TABLET | Freq: Every day | ORAL | 1 refills | Status: AC

## 2024-10-27 MED ORDER — LEVOTHYROXINE SODIUM 100 MCG PO TABS: 100.0000 ug | ORAL_TABLET | Freq: Every day | ORAL | 1 refills | Status: AC

## 2024-10-27 NOTE — Telephone Encounter (Signed)
 Patient contacted the office requesting a glucometer following a recent episode of feeling faint during her visit. Her in-office glucose was 144 mg/dL. After consulting with Dr. Lenis, it was noted that her A1C is within normal limits and her condition was stable. The patient was advised that, without a diagnosis of diabetes, a glucometer would not be covered by insurance and would require out-of-pocket payment. At this time, a meter is not clinically indicated. The patient was instructed to notify the office or seek immediate medical attention (call 911) if symptoms recur or worsen. Patient verbalized understanding.

## 2024-10-27 NOTE — Progress Notes (Signed)
 "                                                    10/27/2024, 10:40 AM   Endocrinology follow-up note  Subjective:    Patient ID: Linda Rollins, female    DOB: [birth year], PCP Bacchus, Meade PEDLAR, FNP   Past Medical History:  Diagnosis Date   Dizziness    Thyroid  disease    Vertigo    Past Surgical History:  Procedure Laterality Date   cortisone shots      feet and knees @ [facility]; Dr. Kit (feet) and Dr. Kay (knees)   DENTAL SURGERY     wisdom teeth removal   LEEP     Social History   Socioeconomic History   Marital status: Single    Spouse name: Not on file   Number of children: 1   Years of education: Not on file   Highest education level: 12th grade  Occupational History   Not on file  Tobacco Use   Smoking status: Never   Smokeless tobacco: Never  Vaping Use   Vaping status: Never Used  Substance and Sexual Activity   Alcohol use: Never   Drug use: Never   Sexual activity: Not on file  Other Topics Concern   Not on file  Social History Narrative   Lives at home with her son   Right handed   Caffeine: no   Social Drivers of Health   Tobacco Use: Low Risk (10/27/2024)   Patient History    Smoking Tobacco Use: Never    Smokeless Tobacco Use: Never    Passive Exposure: Not on file  Financial Resource Strain: Low Risk (03/30/2024)   Overall Financial Resource Strain (CARDIA)    Difficulty of Paying Living Expenses: Not very hard  Food Insecurity: No Food Insecurity (03/30/2024)   Hunger Vital Sign    Worried About Running Out of Food in the Last Year: Never true    Ran Out of Food in the Last Year: Never true  Transportation Needs: No Transportation Needs (03/30/2024)   PRAPARE - Administrator, Civil Service (Medical): No    Lack of Transportation (Non-Medical): No  Physical Activity: Inactive (07/13/2024)   Received from [facility]   Exercise Vital Sign    On average, how many days per week do you engage in moderate to  strenuous exercise (like a brisk walk)?: 0 days    Minutes of Exercise per Session: Not on file  Stress: No Stress Concern Present (03/30/2024)   [employer] of Occupational Health - Occupational Stress Questionnaire    Feeling of Stress : Only a little  Social Connections: Socially Isolated (07/13/2024)   Received from [facility]   Social Connection and Isolation Panel    In a typical week, how many times do you talk on the phone with family, friends, or neighbors?: More than three times a week    How often do you get together with friends or relatives?: More than three times a week    How often do you attend church or religious services?: Never    Do you belong to any clubs or organizations such as church groups, unions, fraternal or athletic groups, or school groups?: No    How often do you attend meetings of the clubs  or organizations you belong to?: Never    Are you married, widowed, divorced, separated, never married, or living with a partner?: Never married  Depression (PHQ2-9): High Risk (08/09/2024)   Depression (PHQ2-9)    PHQ-2 Score: 18  Alcohol Screen: Low Risk (03/30/2024)   Alcohol Screen    Last Alcohol Screening Score (AUDIT): 0  Housing: Unknown (03/30/2024)   Housing Stability Vital Sign    Unable to Pay for Housing in the Last Year: No    Number of Times Moved in the Last Year: Not on file    Homeless in the Last Year: No  Utilities: Not At Risk (03/30/2024)   AHC Utilities    Threatened with loss of utilities: No  Health Literacy: Adequate Health Literacy (03/30/2024)   B1300 Health Literacy    Frequency of need for help with medical instructions: Never   Family History  Problem Relation Age of Onset   Diabetes Mother    Pulmonary embolism Mother    Hypertension Father    High blood pressure Other        father's side   Diabetes Other        mother's side   Heart disease Other        mother's side   Outpatient Encounter Medications as of 10/27/2024   Medication Sig   Acetaminophen 325 MG CAPS Take by mouth as needed.   Cyanocobalamin  (VITAMIN B-12 PO) Take 1 tablet by mouth daily.   ibuprofen (ADVIL,MOTRIN) 200 MG tablet Take 600 mg by mouth daily as needed for moderate pain.    lisinopril -hydrochlorothiazide  (ZESTORETIC ) 10-12.5 MG tablet Take 1 tablet by mouth daily.   MAGNESIUM GLYCINATE PO Take 1 tablet by mouth daily.   rosuvastatin  (CRESTOR ) 20 MG tablet Take 1 tablet (20 mg total) by mouth at bedtime.   Vitamin D -Vitamin K (VITAMIN K2-VITAMIN D3 PO) Take 1 tablet by mouth daily.   [DISCONTINUED] levothyroxine  (SYNTHROID ) 112 MCG tablet Take 1 tablet (112 mcg total) by mouth daily.   [DISCONTINUED] pravastatin  (PRAVACHOL ) 20 MG tablet Take 1 tablet (20 mg total) by mouth at bedtime.   levothyroxine  (SYNTHROID ) 100 MCG tablet Take 1 tablet (100 mcg total) by mouth daily.   No facility-administered encounter medications on file as of 10/27/2024.   ALLERGIES: No Known Allergies  VACCINATION STATUS: Immunization History  Administered Date(s) Administered   Tdap 10/29/2005, 12/11/2016    HPI Linda Rollins is 53 y.o. female who presents today with a medical history as above. she is being seen in follow-up after she was seen consultation for hypothyroidism requested by Edman Meade PEDLAR, FNP. She was diagnosed at approximate age of 38 with hypothyroidism.  Patient denies any thyroid  surgery or ablation.  She has taken various dose of levothyroxine  over the years, currently on 112 mcg p.o. daily before breakfast.  She reports consistency and compliance with medication.  She denies cold intolerance, however reports progressive weight gain and fatigue. Her previsit thyroid  function tests are consistent with over replacement.  Her previous labs confirmed Hashimoto's thyroiditis.  Her previsit thyroid /neck ultrasound was unremarkable.  See below.  Patient also has severe dyslipidemia , recently started on Crestor , tolerating very well.    She was also started on low-dose antihypertensive medication (lisinopril -hydrochlorothiazide  10-12.5 mg) which helped control her blood pressure. During her visit, she was found to have some mild disequilibrium, and blood pressure was 98/60 mmHg.  Blood glucose 144.     Prior records show that elevated blood pressure on several occasions  in the past.  She made some attempts to change her diet after dietary education was provided to her due to her acanthosis nigricans and risk of diabetes  She does have family history of diabetes and coronary artery disease.  She denies dysphagia, dysphonia nor voice change.   Review of Systems  Constitutional: + Presents with 7 pounds of weight loss since last visit,  +  fatigue, no subjective hyperthermia, no subjective hypothermia Eyes: no blurry vision, no xerophthalmia ENT: no sore throat, no nodules palpated in throat, no dysphagia/odynophagia, no hoarseness   Objective:       10/27/2024   10:20 AM 10/08/2024    9:27 AM 08/09/2024    1:04 PM  Vitals with BMI  Height 5' 3 5' 3 5' 3  Weight 264 lbs 6 oz 270 lbs 271 lbs 2 oz  BMI 46.85 47.84 48.04  Systolic 98 97 104  Diastolic 60 74 72  Pulse 116 126 132    BP 98/60   Pulse (!) 116   Resp 18   Ht 5' 3 (1.6 m)   Wt 264 lb 6.4 oz (119.9 kg)   LMP 06/29/2017   SpO2 98%   BMI 46.84 kg/m   Wt Readings from Last 3 Encounters:  10/27/24 264 lb 6.4 oz (119.9 kg)  10/08/24 270 lb (122.5 kg)  08/09/24 271 lb 1.9 oz (123 kg)    Physical Exam  Constitutional:  Body mass index is 46.84 kg/m.,  not in acute distress, normal state of mind Eyes: PERRLA, EOMI, no exophthalmos ENT: moist mucous membranes, + gross thyromegaly, no gross cervical lymphadenopathy, + acanthosis nigricans  Skin: moist, warm, no rashes, + acanthosis nigricans Neurological: no tremor with outstretched hands, Deep tendon reflexes normal in bilateral lower extremities.  CMP ( most recent) CMP     Component  Value Date/Time   NA 139 10/08/2024 0958   K 4.2 10/08/2024 0958   CL 100 10/08/2024 0958   CO2 21 10/08/2024 0958   GLUCOSE 103 (H) 10/08/2024 0958   GLUCOSE 110 (H) 08/15/2023 1755   BUN 14 10/08/2024 0958   CREATININE 1.05 (H) 10/08/2024 0958   CALCIUM  10.2 10/08/2024 0958   PROT 6.4 07/08/2024 0957   ALBUMIN 4.3 07/08/2024 0957   AST 21 07/08/2024 0957   ALT 22 07/08/2024 0957   ALKPHOS 106 07/08/2024 0957   BILITOT 0.7 07/08/2024 0957   EGFR 63 10/08/2024 0958   GFRNONAA >60 08/15/2023 1755     Diabetic Labs (most recent): Lab Results  Component Value Date   HGBA1C 5.0 07/26/2024   HGBA1C 5.0 01/29/2024   HGBA1C 4.9 09/15/2023     Lipid Panel ( most recent) Lipid Panel     Component Value Date/Time   CHOL 241 (H) 07/08/2024 0957   TRIG 155 (H) 07/08/2024 0957   HDL 42 07/08/2024 0957   CHOLHDL 5.7 (H) 07/08/2024 0957   LDLCALC 170 (H) 07/08/2024 0957   LABVLDL 29 07/08/2024 0957      Lab Results  Component Value Date   TSH 0.073 (L) 10/08/2024   TSH 0.707 07/08/2024   TSH 1.630 03/08/2024   TSH 5.330 (H) 01/29/2024   TSH 5.840 (H) 01/19/2024   FREET4 2.06 (H) 10/08/2024   FREET4 1.67 07/08/2024   FREET4 1.91 (H) 03/08/2024   FREET4 1.54 01/29/2024   FREET4 1.72 01/19/2024     Blood Glucose 144 today 10/27/24 Thyroid  ultrasound on July 22, 2024 FINDINGS: Parenchymal Echotexture: Moderately heterogenous   Isthmus: 0.3 cm  Right lobe: 4.9 x 2.3 x 1.5 cm   Left lobe: 3.9 x 1.4 x 0.9 cm   Submitted thyroid  measurements are likely inaccurate.  See below.   IMPRESSION: Very difficult study to interpret due to heterogeneity of the thyroid  gland and poor visualization of the left lobe. The right lobe is also difficult to measure accurately. What was measured as a right lobe nodule is felt to most likely represent residual heterogeneous thyroid  tissue of a tiny right lobe and not a true nodule. The left lobe is extremely difficult to  identify by ultrasound and what was measured is clearly not the thyroid  gland. Only a tiny left lobe was present in 2021 and on the current submitted images truly discernible left thyroid  tissue is not identifiable.  Assessment & Plan:   1. Acquired hypothyroidism 2. Mixed hyperlipidemia 3. Essential hypertension, benign 4. Acanthosis nigricans 5. Morbid obesity (HCC) 6. Goiter   - I have reviewed her  new and available  records and clinically evaluated the patient. - Based on these reviews, she has longstanding hypothyroidism, recent labs confirming Hashimoto thyroiditis and labs consistent with over replacement.  She is advised to lower levothyroxine  to 100 mcg p.o. daily before breakfast.    - We discussed about the correct intake of her thyroid  hormone, on empty stomach at fasting, with water, separated by at least 30 minutes from breakfast and other medications,  and separated by more than 4 hours from calcium , iron, multivitamins, acid reflux medications (PPIs). -Patient is made aware of the fact that thyroid  hormone replacement is needed for life, dose to be adjusted by periodic monitoring of thyroid  function tests.   -She is advised to hold her  lisinopril /HCTZ for 3 days to stabilize her blood pressure.  She is advised to recheck her controlled blood pressure at home to target 130/80 mmHg.    She has tolerated Crestor , advised to continue Crestor  20 mg p.o. nightly.   Side effects and precautions discussed with him.  In light of her clinical goiter, and inconclusive ultrasound findings, she will be considered for CT scan after her next visit.  Her recent  point-of-care A1c was 5% .  - she acknowledges that there is a room for improvement in her food and drink choices. - Suggestion is made for her to avoid simple carbohydrates  from her diet including Cakes, Sweet Desserts, Ice Cream, Soda (diet and regular), Sweet Tea, Candies, Chips, Cookies, Store Bought Juices, Alcohol ,  Artificial Sweeteners,  Coffee Creamer, and Sugar-free Products, Lemonade. This will help patient to have more stable blood glucose profile and potentially avoid unintended weight gain.   - she is advised to maintain close follow up with Bacchus, Meade PEDLAR, FNP for primary care needs.   I spent  27  minutes in the care of the patient today including review of labs from Thyroid  Function, CMP, and other relevant labs ; imaging/biopsy records (current and previous including abstractions from other facilities); face-to-face time discussing  her lab results and symptoms, medications doses, her options of short and long term treatment based on the latest standards of care / guidelines;   and documenting the encounter.  Lorette CHRISTELLA Rung  participated in the discussions, expressed understanding, and voiced agreement with the above plans.  All questions were answered to her satisfaction. she is encouraged to contact clinic should she have any questions or concerns prior to her return visit.    Follow up plan: Return in about 6 months (around 04/26/2025) for  Fasting Labs  in AM B4 8.   Ranny Earl, MD [facility] [address] [place], [place] [address] Phone: [phone]  Fax: [phone]     10/27/2024, 10:40 AM  This note was partially dictated with voice recognition software. Similar sounding words can be transcribed inadequately or may not  be corrected upon review.  "

## 2024-11-03 ENCOUNTER — Ambulatory Visit
Admission: RE | Admit: 2024-11-03 | Discharge: 2024-11-03 | Disposition: A | Discharge status: Home / Self Care | Source: Ambulatory Visit | Attending: Family Medicine | Admitting: Family Medicine

## 2024-11-03 DIAGNOSIS — Z1231 Encounter for screening mammogram for malignant neoplasm of breast: Secondary | ICD-10-CM

## 2024-11-10 ENCOUNTER — Telehealth: Payer: Self-pay

## 2024-11-10 NOTE — Telephone Encounter (Signed)
 Copied from CRM #[id number]. Topic: Appointments - Scheduling Inquiry for Clinic >> Nov 09, 2024  4:35 PM Linda Rollins wrote: Reason for CRM: patient would like to come in sooner than April for a referral, please advise.

## 2024-11-10 NOTE — Telephone Encounter (Signed)
 Scheduled virtual w Meade

## 2024-11-11 ENCOUNTER — Telehealth (INDEPENDENT_AMBULATORY_CARE_PROVIDER_SITE_OTHER): Payer: Self-pay | Admitting: Family Medicine

## 2024-11-11 ENCOUNTER — Encounter (INDEPENDENT_AMBULATORY_CARE_PROVIDER_SITE_OTHER): Payer: Self-pay | Admitting: *Deleted

## 2024-11-11 DIAGNOSIS — Z1211 Encounter for screening for malignant neoplasm of colon: Secondary | ICD-10-CM

## 2024-11-11 NOTE — Progress Notes (Signed)
" ° °  Virtual Visit via Video Note  I connected with Linda Rollins on 11/11/24 at  1:20 PM EST by a video enabled telemedicine application and verified that I am speaking with the correct person using two identifiers.  Patient Location: Home Provider Location: Home Office  I discussed the limitations, risks, security, and privacy concerns of performing an evaluation and management service by video and the availability of in person appointments. I also discussed with the patient that there may be a patient responsible charge related to this service. The patient expressed understanding and agreed to proceed.  Subjective: PCP: Edman Meade PEDLAR, FNP  Chief Complaint  Patient presents with   Referral    Referral to GI   HPI  The patient denies symptoms of colon cancer, including changes in bowel habits, blood in stool, abdominal discomfort, and unexplained weight loss. She is requesting a referral to Gastroenterology for a colonoscopy given her family history of colon cancer, as her father and aunt have a history of colon cancer   ROS: Per HPI Current Medications[1]  Observations/Objective: There were no vitals filed for this visit. Physical Exam Patient is well-developed, well-nourished in no acute distress.  Resting comfortably at home.  Head is normocephalic, atraumatic.  No labored breathing.  Speech is clear and coherent with logical content.  Patient is alert and oriented at baseline.   Assessment and Plan: Colon cancer screening -     Ambulatory referral to Gastroenterology  A referral has been placed.  The patient was encouraged to monitor for and report any concerning symptoms, including changes in bowel habits, blood in stool, abdominal pain, or unexplained weight loss.  Follow Up Instructions: No follow-ups on file.   I discussed the assessment and treatment plan with the patient. The patient was provided an opportunity to ask questions, and all were answered. The  patient agreed with the plan and demonstrated an understanding of the instructions.   The patient was advised to call back or seek an in-person evaluation if the symptoms worsen or if the condition fails to improve as anticipated.  The above assessment and management plan was discussed with the patient. The patient verbalized understanding of and has agreed to the management plan.   Linda Rollins  Z Bacchus, FNP     [1]  Current Outpatient Medications:    Acetaminophen 325 MG CAPS, Take by mouth as needed., Disp: , Rfl:    Cyanocobalamin  (VITAMIN B-12 PO), Take 1 tablet by mouth daily., Disp: , Rfl:    ibuprofen (ADVIL,MOTRIN) 200 MG tablet, Take 600 mg by mouth daily as needed for moderate pain. , Disp: , Rfl:    levothyroxine  (SYNTHROID ) 100 MCG tablet, Take 1 tablet (100 mcg total) by mouth daily., Disp: 90 tablet, Rfl: 1   lisinopril -hydrochlorothiazide  (ZESTORETIC ) 10-12.5 MG tablet, Take 1 tablet by mouth daily., Disp: 90 tablet, Rfl: 1   MAGNESIUM GLYCINATE PO, Take 1 tablet by mouth daily., Disp: , Rfl:    rosuvastatin  (CRESTOR ) 20 MG tablet, Take 1 tablet (20 mg total) by mouth at bedtime., Disp: 90 tablet, Rfl: 1   Vitamin D -Vitamin K (VITAMIN K2-VITAMIN D3 PO), Take 1 tablet by mouth daily., Disp: , Rfl:   "

## 2024-11-17 ENCOUNTER — Ambulatory Visit

## 2024-12-01 ENCOUNTER — Telehealth: Payer: Self-pay

## 2024-12-01 ENCOUNTER — Other Ambulatory Visit: Payer: Self-pay | Admitting: "Endocrinology

## 2024-12-01 MED ORDER — HYDROCHLOROTHIAZIDE 12.5 MG PO TABS: 12.5000 mg | ORAL_TABLET | Freq: Every day | ORAL | 1 refills | Status: AC

## 2024-12-01 NOTE — Telephone Encounter (Signed)
 Pt called stating she has been taking Lisinopril  but has developed a cough. Requesting a Rx for a replacement medication.

## 2024-12-01 NOTE — Telephone Encounter (Signed)
 Pt made aware a Rx for hydrochlorothiazide  12.5mg  daily has been sent to the pharmacy to replace Lisinopril  per Dr. Barbette orders. Pt voiced understanding.

## 2025-01-28 ENCOUNTER — Ambulatory Visit: Payer: Self-pay

## 2025-04-04 ENCOUNTER — Ambulatory Visit

## 2025-04-27 ENCOUNTER — Ambulatory Visit: Admitting: "Endocrinology

## 2025-11-09 ENCOUNTER — Ambulatory Visit: Payer: Self-pay

## 2025-11-10 ENCOUNTER — Ambulatory Visit
